# Patient Record
Sex: Male | Born: 1945 | Race: White | Hispanic: No | Marital: Married | Smoking: Former smoker
Health system: Southern US, Community
[De-identification: ages and names within clinical notes are randomized; demographics above are authoritative.]

## PROBLEM LIST (undated history)

## (undated) DIAGNOSIS — M1990 Unspecified osteoarthritis, unspecified site: Secondary | ICD-10-CM

## (undated) DIAGNOSIS — E785 Hyperlipidemia, unspecified: Secondary | ICD-10-CM

## (undated) DIAGNOSIS — I10 Essential (primary) hypertension: Secondary | ICD-10-CM

## (undated) HISTORY — PX: TOTAL HIP ARTHROPLASTY: SHX124

## (undated) HISTORY — DX: Hyperlipidemia, unspecified: E78.5

## (undated) HISTORY — DX: Essential (primary) hypertension: I10

---

## 2012-07-13 ENCOUNTER — Ambulatory Visit (INDEPENDENT_AMBULATORY_CARE_PROVIDER_SITE_OTHER): Payer: Self-pay | Admitting: Sports Medicine

## 2012-07-13 ENCOUNTER — Encounter: Payer: Self-pay | Admitting: Sports Medicine

## 2012-07-13 ENCOUNTER — Ambulatory Visit (INDEPENDENT_AMBULATORY_CARE_PROVIDER_SITE_OTHER): Payer: Self-pay

## 2012-07-13 VITALS — BP 147/75 | HR 61 | Temp 97.5°F | Wt 227.0 lb

## 2012-07-13 DIAGNOSIS — Z96649 Presence of unspecified artificial hip joint: Secondary | ICD-10-CM

## 2012-07-13 DIAGNOSIS — M25559 Pain in unspecified hip: Secondary | ICD-10-CM

## 2012-07-13 DIAGNOSIS — M545 Low back pain, unspecified: Secondary | ICD-10-CM

## 2012-07-13 DIAGNOSIS — Z299 Encounter for prophylactic measures, unspecified: Secondary | ICD-10-CM | POA: Insufficient documentation

## 2012-07-13 DIAGNOSIS — M79605 Pain in left leg: Secondary | ICD-10-CM | POA: Insufficient documentation

## 2012-07-13 DIAGNOSIS — I10 Essential (primary) hypertension: Secondary | ICD-10-CM

## 2012-07-13 DIAGNOSIS — E785 Hyperlipidemia, unspecified: Secondary | ICD-10-CM

## 2012-07-13 MED ORDER — GABAPENTIN 300 MG PO CAPS: ORAL_CAPSULE | ORAL | Status: DC

## 2012-07-13 MED ORDER — PREDNISONE 50 MG PO TABS: ORAL_TABLET | ORAL | Status: DC

## 2012-07-13 NOTE — Assessment & Plan Note (Signed)
Though unlikely, with pain in the thighs near the end of femoral component we do need to do x-rays to insure no loosening of the components.

## 2012-07-13 NOTE — Progress Notes (Addendum)
Subjective:    CC: Bilateral leg pain  HPI: Lipe is a very pleasant 67 year old male who comes in with a long history of pain that he localizes in the posterior aspect of both thighs radiating down all the way to his feet. He does have a history of lumbar degenerative disc disease and is status post an MRI and what sounds like a single interlaminar epidural steroid injection approximately 15 years ago. He does recall excellent response to this injection. Since then he has had bilateral hip replacements, but no other procedures. He notes he still has occasional back pain, and takes an occasional etodolac for this, his back pain is better with leaning forward while pushing a shopping cart for example. It does not wake him from sleep. He denies any constitutional symptoms. He denies any bowel or bladder dysfunction.  Past medical history, Surgical history, Family history not pertinant except as noted in chart, Social history, Allergies, and medications have been entered into the medical record, reviewed, and no changes needed.   Review of Systems: No headache, visual changes, nausea, vomiting, diarrhea, constipation, dizziness, abdominal pain, skin rash, fevers, chills, night sweats, swollen lymph nodes, weight loss, chest pain, body aches, joint swelling, muscle aches, shortness of breath, mood changes, visual or auditory hallucinations.  Objective:    General: Well Developed, well nourished, and in no acute distress.  Neuro: Alert and oriented x3, extra-ocular muscles intact.  HEENT: Normocephalic, atraumatic, pupils equal round reactive to light, neck supple, no masses, no lymphadenopathy, thyroid nonpalpable.  Skin: Warm and dry, no rashes noted.  Cardiac: Regular rate and rhythm, no murmurs rubs or gallops.  Respiratory: Clear to auscultation bilaterally. Not using accessory muscles, speaking in full sentences.  Abdominal: Soft, nontender, nondistended, positive bowel sounds, no masses, no  organomegaly.  Back Exam:  Inspection: Unremarkable  Motion: Flexion 45 deg, Extension 45 deg, Side Bending to 45 deg bilaterally,  Rotation to 45 deg bilaterally  SLR laying: Negative  XSLR laying: Negative  Palpable tenderness: None. FABER: negative. Sensory change: Gross sensation intact to all lumbar and sacral dermatomes.  Reflexes: 2+ at both patellar tendons, 2+ left Achilles, 1+ at right Achilles tendon, Babinski's downgoing.  Strength at foot  Plantar-flexion: 5/5 Dorsi-flexion: 5/5 Eversion: 5/5 Inversion: 5/5  Leg strength  Quad: 5/5 Hamstring: 5/5 Hip flexor: 5/5 Hip abductors: 5/5  Gait unremarkable.  Impression and Recommendations:    The patient was counselled, risk factors were discussed, anticipatory guidance given.

## 2012-07-13 NOTE — Assessment & Plan Note (Signed)
Slightly elevated but in pain. We can revisit this at a later date.

## 2012-07-13 NOTE — Assessment & Plan Note (Signed)
Symptoms are highly classic for lumbar spinal stenosis with improved pain on flexion. Pain is also radiating down the posterior aspect of both thighs. We will start conservatively with home rehabilitation, prednisone for 5 days, and the gabapentin up taper. We will also do x-rays of his lumbar spine, and I will see him back in 4-6 weeks. If no better, we can pursue MRI for interventional injection planning.

## 2012-07-13 NOTE — Assessment & Plan Note (Signed)
Up-to-date, colonoscopy in June of 2006. PSA was normal December 2012.  Tetanus shot May 2013.

## 2012-07-13 NOTE — Assessment & Plan Note (Signed)
Per patient well controlled, he generally gets this checked and medicines refilled at the [place].

## 2012-07-13 NOTE — Patient Instructions (Addendum)
Spinal Stenosis One cause of back pain is spinal stenosis. Stenosis means abnormal narrowing. The spinal canal contains and protects the spinal nerve roots. In spinal stenosis, the spinal canal narrows and pinches the spinal cord and nerves. This causes low back pain and pain in the legs. Stenosis may pinch the nerves that control muscles and sensation in the legs. This leads to pain and abnormal feelings in the leg muscles and areas supplied by those nerves. CAUSES  Spinal stenosis often happens to people as they get older and arthritic boney growths occur in their spinal canal. There is also a loss of the disk height between the bones of the back, which also adds to this problem. Sometimes the problem is present at birth. SYMPTOMS   Pain that is generally worse with activities, particularly standing and walking.  Numbness, tingling, hot or cold feelings, weakness, or a weariness in the legs.  Clumsiness, frequent falling, and a foot-slapping gait, which may come as a result of nerve pressure and muscle weakness. DIAGNOSIS   Your caregiver may suspect spinal stenosis if you have unusual leg symptoms, such as those previously mentioned.  Your orthopedic surgeon may request special imaging exams, such a computerized magnetic scan (MRI) or computerized X-ray scan (CT) to find out the cause of the problem. TREATMENT   Sometimes treatments such as postural changes or nonsteroidal anti-inflammatory drugs will relieve the pain.  Nonsteroidal anti-inflammatory medications may help relieve symptoms. These medicines do this by decreasing swelling and inflammation in the nerves.  When stenosis causes severe nerve root compression, conservative treatment may not be enough to maintain a normal lifestyle. Surgery may be recommended to relieve the pressure on affected nerves. In properly selected patients, the results are very good, and patients are able to continue a normal lifestyle. HOME CARE  INSTRUCTIONS   Flexing the spine by leaning forward while walking may relieve symptoms. Lying with the knees drawn up to the chest may offer some relief. These positions enlarge the space available to the nerves. They may make it easier for stenosis sufferers to walk longer distances.  Rest, followed by gradually resuming activity, also can help.  Aerobic activity, such as bicycling or swimming, is often recommended.  Losing weight can also relieve some of the load on the spine.  Application of warm or cold compresses to the area of pain can be helpful. SEEK MEDICAL CARE IF:   The periods of relief between episodes of pain become shorter and shorter.  You experience pain that radiates down your leg, even when you are not standing or walking. SEEK IMMEDIATE MEDICAL CARE IF:   You have a loss of bowel or bladder control.  You have a sudden loss of feeling in your legs.  You suddenly cannot move your legs. Document Released: 09/13/2003 Document Revised: 09/15/2011 Document Reviewed: 11/08/2009 [employer] Patient Information 2013 [employer].  

## 2012-07-14 ENCOUNTER — Telehealth: Payer: Self-pay | Admitting: *Deleted

## 2012-07-14 NOTE — Telephone Encounter (Signed)
Pt informed

## 2012-07-14 NOTE — Telephone Encounter (Signed)
Pt calling about x-ray results.  Please advise.

## 2012-07-14 NOTE — Telephone Encounter (Signed)
Hips look okay, back shows multilevel degenerative disc disease which we often see in spinal stenosis. This does not change the treatment plan.

## 2012-07-16 ENCOUNTER — Encounter: Payer: Self-pay | Admitting: Sports Medicine

## 2012-08-17 ENCOUNTER — Ambulatory Visit (INDEPENDENT_AMBULATORY_CARE_PROVIDER_SITE_OTHER): Payer: Self-pay | Admitting: Sports Medicine

## 2012-08-17 ENCOUNTER — Ambulatory Visit (INDEPENDENT_AMBULATORY_CARE_PROVIDER_SITE_OTHER): Payer: Self-pay

## 2012-08-17 VITALS — BP 151/75 | HR 58

## 2012-08-17 DIAGNOSIS — M79605 Pain in left leg: Secondary | ICD-10-CM

## 2012-08-17 DIAGNOSIS — M545 Low back pain, unspecified: Secondary | ICD-10-CM

## 2012-08-17 NOTE — Assessment & Plan Note (Signed)
Symptomatically lumbar spinal stenosis with right-sided lumbar radiculitis, radiating to the anterior aspect of the right thigh. He has failed conservative measures including prednisone, physical therapy, gabapentin, and is on was effective for short time. At this point I do think that we need to pursue MRI for interventional injection planning, this would likely be interlaminar epidural injection on the right side. I would like to see him back to go over results of the MRI and come up with the further plan

## 2012-08-17 NOTE — Progress Notes (Signed)
  Subjective:    CC: Followup  HPI: [name] returns for followup of low back pain. To recap, the pain is localized but does radiate down the right leg in an upper lumbar distribution. He's better with bending forward such as pushing a shopping cart and worse with standing upright. He did have an epidural injection approximately 15 years ago I provided him with good benefit. I briefly put in for formal physical therapy, steroids, and gabapentin, steroids helped significantly but the others were not effective. He did have multilevel degenerative disc disease as well as spondylosis noted on x-rays in the past.  Past medical history, Surgical history, Family history not pertinant except as noted below, Social history, Allergies, and medications have been entered into the medical record, reviewed, and no changes needed.   Review of Systems: No headache, visual changes, nausea, vomiting, diarrhea, constipation, dizziness, abdominal pain, skin rash, fevers, chills, night sweats, weight loss, swollen lymph nodes, body aches, joint swelling, muscle aches, chest pain, shortness of breath, mood changes, visual or auditory hallucinations.   Objective:   General: Well Developed, well nourished, and in no acute distress.  Neuro/Psych: Alert and oriented x3, extra-ocular muscles intact, able to move all 4 extremities, sensation grossly intact. Skin: Warm and dry, no rashes noted.  Respiratory: Not using accessory muscles, speaking in full sentences, trachea midline.  Cardiovascular: Pulses palpable, no extremity edema. Abdomen: Does not appear distended. Back Exam:  Inspection: Unremarkable  Motion: Flexion 45 deg, Extension 45 deg, Side Bending to 45 deg bilaterally,  Rotation to 45 deg bilaterally  SLR laying: Negative  XSLR laying: Negative  Palpable tenderness: None. FABER: negative. Sensory change: Gross sensation intact to all lumbar and sacral dermatomes.  Reflexes: 2+ at both patellar tendons, 2+  left Achilles, 1+ at right Achilles tendon, Babinski's downgoing.  Strength at foot  Plantar-flexion: 5/5 Dorsi-flexion: 5/5 Eversion: 5/5 Inversion: 5/5  Leg strength  Quad: 5/5 Hamstring: 5/5 Hip flexor: 5/5 Hip abductors: 5/5  Gait unremarkable. Impression and Recommendations:   This case required medical decision making of moderate complexity.

## 2012-08-20 ENCOUNTER — Ambulatory Visit: Payer: Self-pay | Admitting: Sports Medicine

## 2012-08-21 ENCOUNTER — Other Ambulatory Visit: Payer: Self-pay

## 2012-08-27 ENCOUNTER — Ambulatory Visit (INDEPENDENT_AMBULATORY_CARE_PROVIDER_SITE_OTHER): Payer: Self-pay | Admitting: Sports Medicine

## 2012-08-27 DIAGNOSIS — M545 Low back pain, unspecified: Secondary | ICD-10-CM

## 2012-08-27 DIAGNOSIS — M79605 Pain in left leg: Secondary | ICD-10-CM

## 2012-08-27 NOTE — Assessment & Plan Note (Signed)
[name] is multilevel bilateral foraminal stenosis with severe degenerative disc disease. Symptoms most likely represent an L4 radiculitis on the right side. Discussed with very bad at the L2-L3 as well as the L3-L4 level. At this point I think we should try a two-level injection. I think certainly we should hit and interlaminar right-sided L4-L5 injection to address the L4 nerve root right side. I think we should also get the L2-L3 level considering the degree of degeneration on MRI.

## 2012-08-27 NOTE — Progress Notes (Signed)
  Subjective:    CC: Followup MRI  HPI: [name] is a very pleasant 67 year old male with right-sided upper lumbar radiculitis comes in to followup results of his MRI. To recap, approximately 15 years ago he had an epidural steroid injection at an unknown level that provided a decade of benefit.  Since then he's had pain in his low back worse with flexion and Valsalva that radiates around the anterolateral aspect of his right thigh and down to the knee. Pain is moderate and stable.  Past medical history, Surgical history, Family history not pertinant except as noted below, Social history, Allergies, and medications have been entered into the medical record, reviewed, and no changes needed.   Review of Systems: No headache, visual changes, nausea, vomiting, diarrhea, constipation, dizziness, abdominal pain, skin rash, fevers, chills, night sweats, weight loss, swollen lymph nodes, body aches, joint swelling, muscle aches, chest pain, shortness of breath, mood changes, visual or auditory hallucinations.   Objective:   General: Well Developed, well nourished, and in no acute distress.  Neuro/Psych: Alert and oriented x3, extra-ocular muscles intact, able to move all 4 extremities, sensation grossly intact. Skin: Warm and dry, no rashes noted.  Respiratory: Not using accessory muscles, speaking in full sentences, trachea midline.  Cardiovascular: Pulses palpable, no extremity edema. Abdomen: Does not appear distended.  MRI was reviewed and shows multilevel degenerative disc disease worse at the L2-L3 level, but also present at the L3-L4, L4-L5, and L5-S1 levels. There is multilevel spinal stenosis.  Impression and Recommendations:   This case required medical decision making of moderate complexity.

## 2012-09-14 ENCOUNTER — Ambulatory Visit
Admission: RE | Admit: 2012-09-14 | Discharge: 2012-09-14 | Disposition: A | Discharge status: Home / Self Care | Payer: No Typology Code available for payment source | Source: Ambulatory Visit | Attending: Sports Medicine | Admitting: Sports Medicine

## 2012-09-14 MED ORDER — METHYLPREDNISOLONE ACETATE 40 MG/ML INJ SUSP (RADIOLOG
120.0000 mg | Freq: Once | INTRAMUSCULAR | Status: AC
  Administered 2012-09-14: 120 mg via EPIDURAL

## 2012-09-14 MED ORDER — IOHEXOL 180 MG/ML  SOLN
1.0000 mL | Freq: Once | INTRAMUSCULAR | Status: AC | PRN
  Administered 2012-09-14: 1 mL via EPIDURAL

## 2012-09-17 ENCOUNTER — Encounter: Payer: Self-pay | Admitting: Sports Medicine

## 2012-09-17 ENCOUNTER — Ambulatory Visit (INDEPENDENT_AMBULATORY_CARE_PROVIDER_SITE_OTHER): Payer: Self-pay | Admitting: Sports Medicine

## 2012-09-17 VITALS — BP 136/76 | HR 51 | Wt 232.0 lb

## 2012-09-17 DIAGNOSIS — M545 Low back pain, unspecified: Secondary | ICD-10-CM

## 2012-09-17 DIAGNOSIS — M79605 Pain in left leg: Secondary | ICD-10-CM

## 2012-09-17 NOTE — Progress Notes (Signed)
  Subjective:    CC: Followup injection  HPI: [name] is multilevel degenerative disc disease with multilevel spinal stenosis as well as foraminal stenosis. He recently underwent an L2-L3 interlaminar epidural steroid injection after failing conservative measures. He reports 100% resolution of pain and is very happy with the results.  Past medical history, Surgical history, Family history not pertinant except as noted below, Social history, Allergies, and medications have been entered into the medical record, reviewed, and no changes needed.   Review of Systems: No headache, visual changes, nausea, vomiting, diarrhea, constipation, dizziness, abdominal pain, skin rash, fevers, chills, night sweats, weight loss, swollen lymph nodes, body aches, joint swelling, muscle aches, chest pain, shortness of breath, mood changes, visual or auditory hallucinations.   Objective:   General: Well Developed, well nourished, and in no acute distress.  Neuro/Psych: Alert and oriented x3, extra-ocular muscles intact, able to move all 4 extremities, sensation grossly intact. Skin: Warm and dry, no rashes noted.  Respiratory: Not using accessory muscles, speaking in full sentences, trachea midline.  Cardiovascular: Pulses palpable, no extremity edema. Abdomen: Does not appear distended. Impression and Recommendations:   This case required medical decision making of moderate complexity.

## 2012-09-17 NOTE — Assessment & Plan Note (Signed)
Status post single interlaminar epidural steroid injection at the L2-L3 level, resolved all symptoms. I am releasing him, he can call us should his symptoms recur a concern over an additional L2-L3 interlaminar epidural steroid injection.

## 2012-10-25 ENCOUNTER — Ambulatory Visit (INDEPENDENT_AMBULATORY_CARE_PROVIDER_SITE_OTHER): Payer: Self-pay | Admitting: Sports Medicine

## 2012-10-25 ENCOUNTER — Encounter: Payer: Self-pay | Admitting: Sports Medicine

## 2012-10-25 VITALS — BP 161/77 | HR 52 | Wt 230.0 lb

## 2012-10-25 DIAGNOSIS — M545 Low back pain, unspecified: Secondary | ICD-10-CM

## 2012-10-25 DIAGNOSIS — M79605 Pain in left leg: Secondary | ICD-10-CM

## 2012-10-25 MED ORDER — TRAMADOL HCL 50 MG PO TABS: 50.0000 mg | ORAL_TABLET | Freq: Three times a day (TID) | ORAL | Status: DC | PRN

## 2012-10-25 NOTE — Progress Notes (Signed)
   Subjective:    CC: Followup  HPI: Lumbar degenerative disc disease: [name] came to see me sometime ago, he went through formal physical therapy, steroids, NSAIDs, muscle relaxers, eventually we got an MRI that showed multilevel degenerative disc disease at nearly every level. As he was having anterior thigh as well as back pain we sent him for an L2-L3 right-sided thalamic epidural steroid injection which provided him with complete relief of his anterior thigh symptoms. He returns today having pain predominately down the posterior aspect of both thighs right side worse than left, radiating down to the anterior aspect of his right knee causing numbness. In the past I had wanted to do a two-level injection, however he was only able to have the L2-L3 level. Pain is stable.  Past medical history, Surgical history, Family history not pertinant except as noted below, Social history, Allergies, and medications have been entered into the medical record, reviewed, and no changes needed.   Review of Systems: No headache, visual changes, nausea, vomiting, diarrhea, constipation, dizziness, abdominal pain, skin rash, fevers, chills, night sweats, weight loss, swollen lymph nodes, body aches, joint swelling, muscle aches, chest pain, shortness of breath, mood changes, visual or auditory hallucinations.   Objective:   General: Well Developed, well nourished, and in no acute distress.  Neuro/Psych: Alert and oriented x3, extra-ocular muscles intact, able to move all 4 extremities, sensation grossly intact. Skin: Warm and dry, no rashes noted.  Respiratory: Not using accessory muscles, speaking in full sentences, trachea midline.  Cardiovascular: Pulses palpable, no extremity edema. Abdomen: Does not appear distended.  I again reviewed the MRI, there is severe degenerative disc disease worse at the L2-L3 and L3-L4 levels, he does have bilateral foraminal stenosis, spinal stenosis as well with a broad-based  disc protrusion at the L2-L3, L3-L4, L4-L5, and L5-S1 levels.  Impression and Recommendations:   This case required medical decision making of moderate complexity.

## 2012-10-25 NOTE — Assessment & Plan Note (Addendum)
Excellent response to L2-L3 interlaminar epidural steroid injection that resolved all pain in the anterior aspect of his thighs. Now with pain he localizes down the posterior aspect of the buttock, thigh, to the anterior aspect of his right knee. This corresponds to the L4 nerve root, I am going to order a right-sided L4-L5 interlaminar epidural steroid injection.  Tramadol as needed until then.

## 2012-11-03 ENCOUNTER — Ambulatory Visit
Admission: RE | Admit: 2012-11-03 | Discharge: 2012-11-03 | Disposition: A | Discharge status: Home / Self Care | Payer: Self-pay | Source: Ambulatory Visit | Attending: Sports Medicine | Admitting: Sports Medicine

## 2012-11-03 ENCOUNTER — Telehealth: Payer: Self-pay | Admitting: *Deleted

## 2012-11-03 MED ORDER — IOHEXOL 180 MG/ML  SOLN
1.0000 mL | Freq: Once | INTRAMUSCULAR | Status: AC | PRN
  Administered 2012-11-03: 1 mL via EPIDURAL

## 2012-11-03 MED ORDER — ORPHENADRINE CITRATE ER 100 MG PO TB12: 100.0000 mg | ORAL_TABLET | Freq: Two times a day (BID) | ORAL | Status: DC

## 2012-11-03 MED ORDER — METHYLPREDNISOLONE ACETATE 40 MG/ML INJ SUSP (RADIOLOG
120.0000 mg | Freq: Once | INTRAMUSCULAR | Status: AC
  Administered 2012-11-03: 120 mg via EPIDURAL

## 2012-11-03 NOTE — Telephone Encounter (Signed)
Pt states he went this am to get his next injection this am he states he is having muscle spasm in his legs. He states they to,d him at GI to call and ask you for something for the spasms.

## 2012-11-03 NOTE — Telephone Encounter (Signed)
Pt informed

## 2012-11-03 NOTE — Telephone Encounter (Signed)
This can occasionally happen. I have added Norflex. If insufficient, I can add Flexeril.

## 2012-11-10 ENCOUNTER — Encounter: Payer: Self-pay | Admitting: Sports Medicine

## 2012-11-10 ENCOUNTER — Ambulatory Visit (INDEPENDENT_AMBULATORY_CARE_PROVIDER_SITE_OTHER): Payer: Self-pay | Admitting: Sports Medicine

## 2012-11-10 VITALS — BP 136/88 | HR 64 | Wt 219.0 lb

## 2012-11-10 DIAGNOSIS — M545 Low back pain, unspecified: Secondary | ICD-10-CM

## 2012-11-10 DIAGNOSIS — M79605 Pain in left leg: Secondary | ICD-10-CM

## 2012-11-10 NOTE — Assessment & Plan Note (Signed)
Resolution of anterior thigh pain with L2-L3 interlaminar epidural. Now with resolution of pain going down the back of the leg with L4-L5 interlaminar epidural. He is happy with the results and understands he can come see me whenever he wants should the pain recur.

## 2012-11-10 NOTE — Progress Notes (Signed)
  Subjective:    CC: Followup after injection  HPI: [name] is a very pleasant 67 year old male with fairly severe degenerative disc disease. He had an L2-L3 interlaminar epidural steroid injection that resolved all of his anterior thigh symptoms, most recently he had an interlaminar L4-L5 epidural injection, which resolved all pain running down his leg. Today he is very happy with the results, and has no complaints.  Past medical history, Surgical history, Family history not pertinant except as noted below, Social history, Allergies, and medications have been entered into the medical record, reviewed, and no changes needed.   Review of Systems: No fevers, chills, night sweats, weight loss, chest pain, or shortness of breath.   Objective:    General: Well Developed, well nourished, and in no acute distress.  Neuro: Alert and oriented x3, extra-ocular muscles intact, sensation grossly intact.  HEENT: Normocephalic, atraumatic, pupils equal round reactive to light, neck supple, no masses, no lymphadenopathy, thyroid nonpalpable.  Skin: Warm and dry, no rashes. Cardiac: Regular rate and rhythm, no murmurs rubs or gallops, no lower extremity edema.  Respiratory: Clear to auscultation bilaterally. Not using accessory muscles, speaking in full sentences. Impression and Recommendations:

## 2013-05-12 ENCOUNTER — Other Ambulatory Visit: Payer: Self-pay

## 2013-11-30 ENCOUNTER — Other Ambulatory Visit (HOSPITAL_COMMUNITY): Payer: Self-pay

## 2013-12-01 NOTE — Pre-Procedure Instructions (Addendum)
[name]  12/01/2013   Your procedure is scheduled on:  Wednesday, June 3rd  [facility] at 1130 AM.  Call this number if you have problems the morning of surgery: [phone]   Remember:   Do not eat food or drink liquids after midnight.   Take these medicines the morning of surgery with A SIP OF WATER: neurontin, ultram if needed   Do not wear jewelry, make-up or nail polish.  Do not wear lotions, powders, or perfumes. You may wear deodorant.  Do not shave 48 hours prior to surgery. Men may shave face and neck.  Do not bring valuables to the hospital.  [facility] is not responsible   for any belongings or valuables.               Contacts, dentures or bridgework may not be worn into surgery.  Leave suitcase in the car. After surgery it may be brought to your room.  For patients admitted to the hospital, discharge time is determined by your  treatment team.               Patients discharged the day of surgery will not be allowed to drive home.  Please read over the following fact sheets that you were given: Pain Booklet, Coughing and Deep Breathing, MRSA Information and Surgical Site Infection Prevention  [place] - Preparing for Surgery  Before surgery, you can play an important role.  Because skin is not sterile, your skin needs to be as free of germs as possible.  You can reduce the number of germs on you skin by washing with CHG (chlorahexidine gluconate) soap before surgery.  CHG is an antiseptic cleaner which kills germs and bonds with the skin to continue killing germs even after washing.  Please DO NOT use if you have an allergy to CHG or antibacterial soaps.  If your skin becomes reddened/irritated stop using the CHG and inform your nurse when you arrive at Short Stay.  Do not shave (including legs and underarms) for at least 48 hours prior to the first CHG shower.  You may shave your face.  Please follow these instructions  carefully:   1.  Shower with CHG Soap the night before surgery and the morning of Surgery.  2.  If you choose to wash your hair, wash your hair first as usual with your normal shampoo.  3.  After you shampoo, rinse your hair and body thoroughly to remove the shampoo.  4.  Use CHG as you would any other liquid soap.  You can apply CHG directly to the skin and wash gently with scrungie or a clean washcloth.  5.  Apply the CHG Soap to your body ONLY FROM THE NECK DOWN.  Do not use on open wounds or open sores.  Avoid contact with your eyes, ears, mouth and genitals (private parts).  Wash genitals (private parts) with your normal soap.  6.  Wash thoroughly, paying special attention to the area where your surgery will be performed.  7.  Thoroughly rinse your body with warm water from the neck down.  8.  DO NOT shower/wash with your normal soap after using and rinsing off the CHG Soap.  9.  Pat yourself dry with a clean towel.            10.  Wear clean pajamas.            11.  Place clean sheets on your bed  the night of your first shower and do not sleep with pets.  Day of Surgery  Do not apply any lotions/deoderants the morning of surgery.  Please wear clean clothes to the hospital/surgery center.

## 2013-12-02 ENCOUNTER — Encounter (HOSPITAL_COMMUNITY)
Admission: RE | Admit: 2013-12-02 | Discharge: 2013-12-02 | Disposition: A | Discharge status: Home / Self Care | Payer: Worker's Compensation | Source: Ambulatory Visit | Attending: Anesthesiology | Admitting: Anesthesiology

## 2013-12-02 ENCOUNTER — Ambulatory Visit (HOSPITAL_COMMUNITY): Admission: RE | Admit: 2013-12-02 | Payer: Worker's Compensation | Source: Ambulatory Visit

## 2013-12-02 ENCOUNTER — Encounter (HOSPITAL_COMMUNITY): Payer: Self-pay

## 2013-12-02 ENCOUNTER — Encounter (HOSPITAL_COMMUNITY)
Admission: RE | Admit: 2013-12-02 | Discharge: 2013-12-02 | Disposition: A | Discharge status: Home / Self Care | Payer: Worker's Compensation | Source: Ambulatory Visit | Attending: Orthopedic Surgery | Admitting: Orthopedic Surgery

## 2013-12-02 DIAGNOSIS — Z01812 Encounter for preprocedural laboratory examination: Secondary | ICD-10-CM | POA: Insufficient documentation

## 2013-12-02 DIAGNOSIS — Z01818 Encounter for other preprocedural examination: Secondary | ICD-10-CM | POA: Insufficient documentation

## 2013-12-02 HISTORY — DX: Unspecified osteoarthritis, unspecified site: M19.90

## 2013-12-02 LAB — SURGICAL PCR SCREEN
MRSA, PCR: NEGATIVE
Staphylococcus aureus: NEGATIVE

## 2013-12-02 LAB — CBC
HCT: 41.6 % (ref 39.0–52.0)
HEMOGLOBIN: 14.5 g/dL (ref 13.0–17.0)
MCH: 29.2 pg (ref 26.0–34.0)
MCHC: 34.9 g/dL (ref 30.0–36.0)
MCV: 83.7 fL (ref 78.0–100.0)
Platelets: 208 10*3/uL (ref 150–400)
RBC: 4.97 MIL/uL (ref 4.22–5.81)
RDW: 12.8 % (ref 11.5–15.5)
WBC: 5.2 10*3/uL (ref 4.0–10.5)

## 2013-12-02 LAB — COMPREHENSIVE METABOLIC PANEL
ALT: 18 U/L (ref 0–53)
AST: 25 U/L (ref 0–37)
Albumin: 3.6 g/dL (ref 3.5–5.2)
Alkaline Phosphatase: 66 U/L (ref 39–117)
BUN: 16 mg/dL (ref 6–23)
CALCIUM: 9.8 mg/dL (ref 8.4–10.5)
CO2: 27 mEq/L (ref 19–32)
CREATININE: 0.89 mg/dL (ref 0.50–1.35)
Chloride: 101 mEq/L (ref 96–112)
GFR calc Af Amer: >90 mL/min (ref >90)
GFR, EST NON AFRICAN AMERICAN: 88 mL/min — AB (ref >90)
GLUCOSE: 133 mg/dL — AB (ref 70–99)
Potassium: 3.9 mEq/L (ref 3.7–5.3)
SODIUM: 142 meq/L (ref 137–147)
TOTAL PROTEIN: 6.9 g/dL (ref 6.0–8.3)
Total Bilirubin: 0.6 mg/dL (ref 0.3–1.2)

## 2013-12-02 NOTE — Progress Notes (Addendum)
Have never seen a cardio and denies any heart problems. Requested LOV & ekg from [facility].   [phone]

## 2013-12-06 MED ORDER — ACETAMINOPHEN 10 MG/ML IV SOLN
1000.0000 mg | Freq: Four times a day (QID) | INTRAVENOUS | Status: DC
  Filled 2013-12-06: qty 100

## 2013-12-06 MED ORDER — CEFAZOLIN SODIUM-DEXTROSE 2-3 GM-% IV SOLR
2.0000 g | INTRAVENOUS | Status: AC
  Administered 2013-12-07: 2 g via INTRAVENOUS
  Filled 2013-12-06: qty 50

## 2013-12-06 MED ORDER — DEXAMETHASONE SODIUM PHOSPHATE 4 MG/ML IJ SOLN
4.0000 mg | Freq: Once | INTRAMUSCULAR | Status: AC
  Administered 2013-12-07: 10 mg via INTRAVENOUS
  Filled 2013-12-06: qty 1

## 2013-12-06 NOTE — Progress Notes (Signed)
Patient called with time change. Message left to arrive at 1100 am

## 2013-12-07 ENCOUNTER — Observation Stay (HOSPITAL_COMMUNITY): Payer: Worker's Compensation

## 2013-12-07 ENCOUNTER — Ambulatory Visit (HOSPITAL_COMMUNITY): Payer: Worker's Compensation

## 2013-12-07 ENCOUNTER — Encounter (HOSPITAL_COMMUNITY)
Admission: RE | Disposition: A | Discharge status: Home / Self Care | Payer: Self-pay | Source: Ambulatory Visit | Attending: Orthopedic Surgery

## 2013-12-07 ENCOUNTER — Observation Stay (HOSPITAL_COMMUNITY)
Admission: RE | Admit: 2013-12-07 | Discharge: 2013-12-08 | Disposition: A | Discharge status: Home / Self Care | Payer: Worker's Compensation | Source: Ambulatory Visit | Attending: Orthopedic Surgery | Admitting: Orthopedic Surgery

## 2013-12-07 ENCOUNTER — Encounter (HOSPITAL_COMMUNITY): Payer: Worker's Compensation | Admitting: Anesthesiology

## 2013-12-07 ENCOUNTER — Ambulatory Visit (HOSPITAL_COMMUNITY): Payer: Worker's Compensation | Admitting: Anesthesiology

## 2013-12-07 ENCOUNTER — Encounter (HOSPITAL_COMMUNITY): Payer: Self-pay | Admitting: *Deleted

## 2013-12-07 DIAGNOSIS — Z981 Arthrodesis status: Secondary | ICD-10-CM

## 2013-12-07 DIAGNOSIS — X500XXA Overexertion from strenuous movement or load, initial encounter: Secondary | ICD-10-CM | POA: Insufficient documentation

## 2013-12-07 DIAGNOSIS — M5020 Other cervical disc displacement, unspecified cervical region: Principal | ICD-10-CM | POA: Insufficient documentation

## 2013-12-07 DIAGNOSIS — M542 Cervicalgia: Secondary | ICD-10-CM | POA: Diagnosis present

## 2013-12-07 DIAGNOSIS — Z87891 Personal history of nicotine dependence: Secondary | ICD-10-CM | POA: Insufficient documentation

## 2013-12-07 HISTORY — PX: ANTERIOR CERVICAL DECOMP/DISCECTOMY FUSION: SHX1161

## 2013-12-07 SURGERY — ANTERIOR CERVICAL DECOMPRESSION/DISCECTOMY FUSION 2 LEVELS
Anesthesia: General | Site: Spine Cervical | Laterality: Right

## 2013-12-07 MED ORDER — GLYCOPYRROLATE 0.2 MG/ML IJ SOLN
INTRAMUSCULAR | Status: AC
  Filled 2013-12-07: qty 3

## 2013-12-07 MED ORDER — NEOSTIGMINE METHYLSULFATE 10 MG/10ML IV SOLN
INTRAVENOUS | Status: DC | PRN
  Administered 2013-12-07: 4 mg via INTRAVENOUS

## 2013-12-07 MED ORDER — ACETAMINOPHEN 10 MG/ML IV SOLN
1000.0000 mg | Freq: Four times a day (QID) | INTRAVENOUS | Status: DC
  Administered 2013-12-07 – 2013-12-08 (×3): 1000 mg via INTRAVENOUS
  Filled 2013-12-07 (×3): qty 100

## 2013-12-07 MED ORDER — OXYCODONE HCL 5 MG PO TABS
ORAL_TABLET | ORAL | Status: AC
Start: 2013-12-07 — End: 2013-12-08
  Filled 2013-12-07: qty 1

## 2013-12-07 MED ORDER — MIDAZOLAM HCL 5 MG/5ML IJ SOLN
INTRAMUSCULAR | Status: DC | PRN
  Administered 2013-12-07: 2 mg via INTRAVENOUS

## 2013-12-07 MED ORDER — PROPOFOL 10 MG/ML IV BOLUS
INTRAVENOUS | Status: AC
  Filled 2013-12-07: qty 20

## 2013-12-07 MED ORDER — SODIUM CHLORIDE 0.9 % IJ SOLN
INTRAMUSCULAR | Status: AC
  Filled 2013-12-07: qty 10

## 2013-12-07 MED ORDER — ACETAMINOPHEN 10 MG/ML IV SOLN
INTRAVENOUS | Status: AC
  Filled 2013-12-07: qty 100

## 2013-12-07 MED ORDER — MENTHOL 3 MG MT LOZG: 1.0000 | LOZENGE | OROMUCOSAL | Status: DC | PRN

## 2013-12-07 MED ORDER — FLEET ENEMA 7-19 GM/118ML RE ENEM: 1.0000 | ENEMA | Freq: Once | RECTAL | Status: AC | PRN

## 2013-12-07 MED ORDER — MIDAZOLAM HCL 2 MG/2ML IJ SOLN
INTRAMUSCULAR | Status: AC
  Filled 2013-12-07: qty 2

## 2013-12-07 MED ORDER — HEMOSTATIC AGENTS (NO CHARGE) OPTIME
TOPICAL | Status: DC | PRN
  Administered 2013-12-07: 1 via TOPICAL

## 2013-12-07 MED ORDER — PROMETHAZINE HCL 25 MG/ML IJ SOLN: 6.2500 mg | INTRAMUSCULAR | Status: DC | PRN

## 2013-12-07 MED ORDER — PHENOL 1.4 % MT LIQD
1.0000 | OROMUCOSAL | Status: DC | PRN
  Administered 2013-12-08: 1 via OROMUCOSAL
  Filled 2013-12-07: qty 177

## 2013-12-07 MED ORDER — GLYCOPYRROLATE 0.2 MG/ML IJ SOLN
INTRAMUSCULAR | Status: DC | PRN
  Administered 2013-12-07: 0.6 mg via INTRAVENOUS

## 2013-12-07 MED ORDER — FENTANYL CITRATE 0.05 MG/ML IJ SOLN
INTRAMUSCULAR | Status: AC
  Filled 2013-12-07: qty 5

## 2013-12-07 MED ORDER — LACTATED RINGERS IV SOLN
INTRAVENOUS | Status: DC
  Administered 2013-12-07 (×2): via INTRAVENOUS

## 2013-12-07 MED ORDER — ACETAMINOPHEN 10 MG/ML IV SOLN
INTRAVENOUS | Status: DC | PRN
  Administered 2013-12-07: 1000 mg via INTRAVENOUS

## 2013-12-07 MED ORDER — SODIUM CHLORIDE 0.9 % IJ SOLN: 3.0000 mL | INTRAMUSCULAR | Status: DC | PRN

## 2013-12-07 MED ORDER — SODIUM CHLORIDE 0.9 % IV SOLN: 250.0000 mL | INTRAVENOUS | Status: DC

## 2013-12-07 MED ORDER — BENAZEPRIL HCL 20 MG PO TABS
20.0000 mg | ORAL_TABLET | Freq: Every day | ORAL | Status: DC
  Administered 2013-12-07 – 2013-12-08 (×2): 20 mg via ORAL
  Filled 2013-12-07 (×2): qty 1

## 2013-12-07 MED ORDER — VECURONIUM BROMIDE 10 MG IV SOLR
INTRAVENOUS | Status: DC | PRN
  Administered 2013-12-07: 3 mg via INTRAVENOUS
  Administered 2013-12-07 (×3): 2 mg via INTRAVENOUS
  Administered 2013-12-07: 1 mg via INTRAVENOUS

## 2013-12-07 MED ORDER — FENTANYL CITRATE 0.05 MG/ML IJ SOLN
INTRAMUSCULAR | Status: DC | PRN
  Administered 2013-12-07 (×2): 50 ug via INTRAVENOUS
  Administered 2013-12-07: 100 ug via INTRAVENOUS
  Administered 2013-12-07 (×2): 50 ug via INTRAVENOUS

## 2013-12-07 MED ORDER — ONDANSETRON HCL 4 MG/2ML IJ SOLN
INTRAMUSCULAR | Status: AC
  Filled 2013-12-07: qty 2

## 2013-12-07 MED ORDER — METHOCARBAMOL 500 MG PO TABS
ORAL_TABLET | ORAL | Status: AC
Start: 2013-12-07 — End: 2013-12-08
  Filled 2013-12-07: qty 1

## 2013-12-07 MED ORDER — HYDROCHLOROTHIAZIDE 12.5 MG PO CAPS
12.5000 mg | ORAL_CAPSULE | Freq: Every day | ORAL | Status: DC
  Administered 2013-12-07 – 2013-12-08 (×2): 12.5 mg via ORAL
  Filled 2013-12-07 (×2): qty 1

## 2013-12-07 MED ORDER — CEFAZOLIN SODIUM 1-5 GM-% IV SOLN
1.0000 g | Freq: Three times a day (TID) | INTRAVENOUS | Status: AC
  Administered 2013-12-07 – 2013-12-08 (×2): 1 g via INTRAVENOUS
  Filled 2013-12-07 (×2): qty 50

## 2013-12-07 MED ORDER — METHOCARBAMOL 500 MG PO TABS
500.0000 mg | ORAL_TABLET | Freq: Four times a day (QID) | ORAL | Status: DC | PRN
  Administered 2013-12-07: 500 mg via ORAL

## 2013-12-07 MED ORDER — OXYCODONE HCL 5 MG/5ML PO SOLN: 5.0000 mg | Freq: Once | ORAL | Status: AC | PRN

## 2013-12-07 MED ORDER — DOCUSATE SODIUM 100 MG PO CAPS
100.0000 mg | ORAL_CAPSULE | Freq: Two times a day (BID) | ORAL | Status: DC
  Administered 2013-12-07 – 2013-12-08 (×2): 100 mg via ORAL
  Filled 2013-12-07 (×2): qty 1

## 2013-12-07 MED ORDER — MORPHINE SULFATE 2 MG/ML IJ SOLN: 1.0000 mg | INTRAMUSCULAR | Status: DC | PRN

## 2013-12-07 MED ORDER — DEXAMETHASONE 4 MG PO TABS
4.0000 mg | ORAL_TABLET | Freq: Four times a day (QID) | ORAL | Status: DC
  Administered 2013-12-08 (×3): 4 mg via ORAL
  Filled 2013-12-07 (×6): qty 1

## 2013-12-07 MED ORDER — OXYCODONE HCL 5 MG PO TABS
10.0000 mg | ORAL_TABLET | ORAL | Status: DC | PRN
  Administered 2013-12-07 – 2013-12-08 (×2): 10 mg via ORAL
  Filled 2013-12-07: qty 2

## 2013-12-07 MED ORDER — LIDOCAINE HCL (CARDIAC) 20 MG/ML IV SOLN
INTRAVENOUS | Status: AC
  Filled 2013-12-07: qty 5

## 2013-12-07 MED ORDER — BUPIVACAINE-EPINEPHRINE (PF) 0.25% -1:200000 IJ SOLN
INTRAMUSCULAR | Status: AC
  Filled 2013-12-07: qty 30

## 2013-12-07 MED ORDER — DEXTROSE 5 % IV SOLN
500.0000 mg | Freq: Four times a day (QID) | INTRAVENOUS | Status: DC | PRN
  Filled 2013-12-07: qty 5

## 2013-12-07 MED ORDER — ONDANSETRON HCL 4 MG/2ML IJ SOLN: 4.0000 mg | INTRAMUSCULAR | Status: DC | PRN

## 2013-12-07 MED ORDER — DEXAMETHASONE SODIUM PHOSPHATE 4 MG/ML IJ SOLN
4.0000 mg | Freq: Four times a day (QID) | INTRAMUSCULAR | Status: DC
  Administered 2013-12-07: 4 mg via INTRAVENOUS
  Filled 2013-12-07 (×6): qty 1

## 2013-12-07 MED ORDER — OXYCODONE HCL 5 MG PO TABS
ORAL_TABLET | ORAL | Status: AC
  Filled 2013-12-07: qty 2

## 2013-12-07 MED ORDER — EPHEDRINE SULFATE 50 MG/ML IJ SOLN
INTRAMUSCULAR | Status: DC | PRN
  Administered 2013-12-07 (×4): 10 mg via INTRAVENOUS

## 2013-12-07 MED ORDER — BENAZEPRIL-HYDROCHLOROTHIAZIDE 20-12.5 MG PO TABS: 1.0000 | ORAL_TABLET | Freq: Every day | ORAL | Status: DC

## 2013-12-07 MED ORDER — ONDANSETRON HCL 4 MG/2ML IJ SOLN
INTRAMUSCULAR | Status: DC | PRN
  Administered 2013-12-07: 4 mg via INTRAVENOUS

## 2013-12-07 MED ORDER — HYDROMORPHONE HCL PF 1 MG/ML IJ SOLN
0.2500 mg | INTRAMUSCULAR | Status: DC | PRN
  Administered 2013-12-07 (×4): 0.5 mg via INTRAVENOUS

## 2013-12-07 MED ORDER — LACTATED RINGERS IV SOLN
INTRAVENOUS | Status: DC
  Administered 2013-12-07: via INTRAVENOUS

## 2013-12-07 MED ORDER — OXYCODONE HCL 5 MG PO TABS
5.0000 mg | ORAL_TABLET | Freq: Once | ORAL | Status: AC | PRN
  Administered 2013-12-07: 5 mg via ORAL

## 2013-12-07 MED ORDER — ROCURONIUM BROMIDE 50 MG/5ML IV SOLN
INTRAVENOUS | Status: AC
  Filled 2013-12-07: qty 1

## 2013-12-07 MED ORDER — THROMBIN 20000 UNITS EX SOLR
CUTANEOUS | Status: AC
Start: 2013-12-07 — End: 2013-12-07
  Filled 2013-12-07: qty 20000

## 2013-12-07 MED ORDER — 0.9 % SODIUM CHLORIDE (POUR BTL) OPTIME
TOPICAL | Status: DC | PRN
  Administered 2013-12-07: 1000 mL

## 2013-12-07 MED ORDER — DEXAMETHASONE SODIUM PHOSPHATE 4 MG/ML IJ SOLN
INTRAMUSCULAR | Status: AC
  Filled 2013-12-07: qty 1

## 2013-12-07 MED ORDER — HYDROMORPHONE HCL PF 1 MG/ML IJ SOLN
INTRAMUSCULAR | Status: AC
Start: 2013-12-07 — End: 2013-12-08
  Filled 2013-12-07: qty 1

## 2013-12-07 MED ORDER — EPHEDRINE SULFATE 50 MG/ML IJ SOLN
INTRAMUSCULAR | Status: AC
  Filled 2013-12-07: qty 1

## 2013-12-07 MED ORDER — LIDOCAINE HCL (CARDIAC) 20 MG/ML IV SOLN
INTRAVENOUS | Status: DC | PRN
  Administered 2013-12-07: 100 mg via INTRAVENOUS

## 2013-12-07 MED ORDER — BUPIVACAINE-EPINEPHRINE 0.25% -1:200000 IJ SOLN
INTRAMUSCULAR | Status: DC | PRN
  Administered 2013-12-07: 4 mL

## 2013-12-07 MED ORDER — SIMVASTATIN 20 MG PO TABS
20.0000 mg | ORAL_TABLET | Freq: Every evening | ORAL | Status: DC
  Administered 2013-12-07: 20 mg via ORAL
  Filled 2013-12-07 (×2): qty 1

## 2013-12-07 MED ORDER — HYDROMORPHONE HCL PF 1 MG/ML IJ SOLN
INTRAMUSCULAR | Status: AC
  Filled 2013-12-07: qty 1

## 2013-12-07 MED ORDER — SODIUM CHLORIDE 0.9 % IJ SOLN: 3.0000 mL | Freq: Two times a day (BID) | INTRAMUSCULAR | Status: DC

## 2013-12-07 MED ORDER — PROPOFOL 10 MG/ML IV BOLUS
INTRAVENOUS | Status: DC | PRN
  Administered 2013-12-07: 150 mg via INTRAVENOUS

## 2013-12-07 MED ORDER — THROMBIN 20000 UNITS EX SOLR
CUTANEOUS | Status: DC | PRN
  Administered 2013-12-07: 14:00:00 via TOPICAL

## 2013-12-07 MED ORDER — NEOSTIGMINE METHYLSULFATE 10 MG/10ML IV SOLN
INTRAVENOUS | Status: AC
  Filled 2013-12-07: qty 1

## 2013-12-07 MED ORDER — ROCURONIUM BROMIDE 100 MG/10ML IV SOLN
INTRAVENOUS | Status: DC | PRN
  Administered 2013-12-07: 50 mg via INTRAVENOUS

## 2013-12-07 SURGICAL SUPPLY — 66 items
BIT DRILL SRG 14X2.2XFLT CHK (BIT) ×1 IMPLANT
BIT DRL SRG 14X2.2XFLT CHK (BIT) ×1
BLADE SURG ROTATE 9660 (MISCELLANEOUS) IMPLANT
BUR EGG ELITE 4.0 (BURR) IMPLANT
BUR EGG ELITE 4.0MM (BURR)
BUR MATCHSTICK NEURO 3.0 LAGG (BURR) IMPLANT
CANISTER SUCTION 2500CC (MISCELLANEOUS) ×3 IMPLANT
CLOSURE STERI-STRIP 1/2X4 (GAUZE/BANDAGES/DRESSINGS) ×1
CLSR STERI-STRIP ANTIMIC 1/2X4 (GAUZE/BANDAGES/DRESSINGS) ×2 IMPLANT
CORDS BIPOLAR (ELECTRODE) ×3 IMPLANT
COVER SURGICAL LIGHT HANDLE (MISCELLANEOUS) ×6 IMPLANT
CRADLE DONUT ADULT HEAD (MISCELLANEOUS) ×3 IMPLANT
DEVICE ENDSKLTN TC MED 8MM (Orthopedic Implant) ×2 IMPLANT
DRAPE C-ARM 42X72 X-RAY (DRAPES) ×3 IMPLANT
DRAPE POUCH INSTRU U-SHP 10X18 (DRAPES) ×3 IMPLANT
DRAPE SURG 17X23 STRL (DRAPES) ×3 IMPLANT
DRAPE U-SHAPE 47X51 STRL (DRAPES) ×3 IMPLANT
DRILL BIT SKYLINE 14MM (BIT) ×2
DRSG MEPILEX BORDER 4X4 (GAUZE/BANDAGES/DRESSINGS) ×3 IMPLANT
DURAPREP 26ML APPLICATOR (WOUND CARE) ×3 IMPLANT
ELECT COATED BLADE 2.86 ST (ELECTRODE) ×3 IMPLANT
ELECT PENCIL ROCKER SW 15FT (MISCELLANEOUS) ×3 IMPLANT
ELECT REM PT RETURN 9FT ADLT (ELECTROSURGICAL) ×3
ELECTRODE REM PT RTRN 9FT ADLT (ELECTROSURGICAL) ×1 IMPLANT
ENDOSKELTON TC IMPLANT 8MM MED (Orthopedic Implant) ×6 IMPLANT
GLOVE BIOGEL PI IND STRL 8 (GLOVE) ×1 IMPLANT
GLOVE BIOGEL PI IND STRL 8.5 (GLOVE) ×1 IMPLANT
GLOVE BIOGEL PI INDICATOR 8 (GLOVE) ×2
GLOVE BIOGEL PI INDICATOR 8.5 (GLOVE) ×2
GLOVE ECLIPSE 8.5 STRL (GLOVE) ×3 IMPLANT
GLOVE ORTHO TXT STRL SZ7.5 (GLOVE) ×3 IMPLANT
GOWN STRL REUS W/ TWL XL LVL3 (GOWN DISPOSABLE) ×2 IMPLANT
GOWN STRL REUS W/TWL 2XL LVL3 (GOWN DISPOSABLE) ×6 IMPLANT
GOWN STRL REUS W/TWL XL LVL3 (GOWN DISPOSABLE) ×4
KIT BASIN OR (CUSTOM PROCEDURE TRAY) ×3 IMPLANT
KIT ROOM TURNOVER OR (KITS) ×3 IMPLANT
NEEDLE SPNL 18GX3.5 QUINCKE PK (NEEDLE) ×3 IMPLANT
NS IRRIG 1000ML POUR BTL (IV SOLUTION) ×6 IMPLANT
PACK ORTHO CERVICAL (CUSTOM PROCEDURE TRAY) ×3 IMPLANT
PACK UNIVERSAL I (CUSTOM PROCEDURE TRAY) ×3 IMPLANT
PAD ARMBOARD 7.5X6 YLW CONV (MISCELLANEOUS) ×6 IMPLANT
PATTIES SURGICAL .25X.25 (GAUZE/BANDAGES/DRESSINGS) ×3 IMPLANT
PIN DISTRACTION 14 (PIN) ×3 IMPLANT
PIN RETAINER PRODISC 14 MM (PIN) ×3 IMPLANT
PLATE SKYLINE 2 LEVEL 34MM (Plate) ×3 IMPLANT
PUTTY BONE DBX 5CC MIX (Putty) ×3 IMPLANT
RESTRAINT LIMB HOLDER UNIV (RESTRAINTS) ×3 IMPLANT
SCREW SKYLINE 14MM SD-VA (Screw) ×12 IMPLANT
SCREW SKYLINE 16MM (Screw) ×6 IMPLANT
SPONGE INTESTINAL PEANUT (DISPOSABLE) ×3 IMPLANT
SPONGE LAP 4X18 X RAY DECT (DISPOSABLE) ×6 IMPLANT
SPONGE SURGIFOAM ABS GEL 100 (HEMOSTASIS) ×3 IMPLANT
SURGIFLO TRUKIT (HEMOSTASIS) ×3 IMPLANT
SUT MON AB 3-0 SH 27 (SUTURE) ×2
SUT MON AB 3-0 SH27 (SUTURE) ×1 IMPLANT
SUT SILK 2 0 (SUTURE)
SUT SILK 2-0 18XBRD TIE 12 (SUTURE) IMPLANT
SUT VIC AB 2-0 CT1 18 (SUTURE) ×3 IMPLANT
SYR BULB IRRIGATION 50ML (SYRINGE) ×3 IMPLANT
SYR CONTROL 10ML LL (SYRINGE) ×3 IMPLANT
TAPE CLOTH 4X10 WHT NS (GAUZE/BANDAGES/DRESSINGS) ×3 IMPLANT
TAPE UMBILICAL COTTON 1/8X30 (MISCELLANEOUS) ×3 IMPLANT
TOWEL OR 17X24 6PK STRL BLUE (TOWEL DISPOSABLE) ×3 IMPLANT
TOWEL OR 17X26 10 PK STRL BLUE (TOWEL DISPOSABLE) ×3 IMPLANT
WATER STERILE IRR 1000ML POUR (IV SOLUTION) IMPLANT
YANKAUER SUCT BULB TIP NO VENT (SUCTIONS) ×3 IMPLANT

## 2013-12-07 NOTE — Anesthesia Preprocedure Evaluation (Addendum)
Anesthesia Evaluation  Patient identified by MRN, date of birth, ID band Patient awake    Reviewed: Allergy & Precautions, H&P , NPO status , Patient's Chart, lab work & pertinent test results  History of Anesthesia Complications Negative for: history of anesthetic complications  Airway Mallampati: II TM Distance: >3 FB Neck ROM: Full    Dental  (+) Edentulous Upper, Edentulous Lower, Dental Advisory Given   Pulmonary former smoker,    Pulmonary exam normal       Cardiovascular hypertension, Pt. on medications     Neuro/Psych negative psych ROS   GI/Hepatic negative GI ROS, Neg liver ROS,   Endo/Other  negative endocrine ROS  Renal/GU negative Renal ROS     Musculoskeletal  (+) Arthritis -,   Abdominal   Peds  Hematology   Anesthesia Other Findings   Reproductive/Obstetrics                         Anesthesia Physical Anesthesia Plan  ASA: II  Anesthesia Plan: General   Post-op Pain Management:    Induction: Intravenous  Airway Management Planned: Oral ETT  Additional Equipment:   Intra-op Plan:   Post-operative Plan: Extubation in OR  Informed Consent: I have reviewed the patients History and Physical, chart, labs and discussed the procedure including the risks, benefits and alternatives for the proposed anesthesia with the patient or authorized representative who has indicated his/her understanding and acceptance.   Dental advisory given  Plan Discussed with: CRNA, Anesthesiologist and Surgeon  Anesthesia Plan Comments:        Anesthesia Quick Evaluation

## 2013-12-07 NOTE — H&P (Signed)
History of Present Illness The patient is a 68 year old male who comes in today for a preoperative History and Physical. The patient is scheduled for a ACDF C4-6 to be performed by Dr. Debria Garret D. Shon Baton, MD at [facility] on 12/07/2013 . Please see the hospital record for complete dictated history and physical.  Additional reason for visit:  Neck painis described as the following: The patient is seen today in referral from Dr [name]. The patient reports symptoms involving the right lateral neck ago. The symptoms began following a specific injury (DOI 09/03/13). The injury occurred while the patient was at work (lifting). Symptoms include neck pain and arm numbness (into the fingers). The pain radiates to the right upper arm.The patient describes their symptoms as severe.The patient does feel that the symptoms are unchanged. Symptoms are exacerbated by turning the head to the right, neck flexion and neck extension. Prior to being seen today the patient was previously evaluated in urgent care (and Dr [name]). Past treatment has included nonsteroidal anti-inflammatory drugs (Diclofenac) and opioid analgesics (and Prednisone). 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 Note for "Neck pain": The patient was placed on light duty with no lifting over 10lbs. and no use of right arm.    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 He states that symptoms are unchanged from previous visit. He is wanting to proceed with C4 to 6 ACDF as scheduled.     Allergies No Known Drug Allergies. 09/19/2013    Family History Heart Disease. Mother. Hypertension. Mother. First Degree Relatives Cancer. Father. Cerebrovascular Accident. Sister. Congestive Heart Failure. Maternal Grandmother, Paternal Grandfather, Paternal Grandmother.    Social History Not under pain contract No  history of drug/alcohol rehab Number of flights of stairs before winded. less than 1 Tobacco use. Former smoker. 09/19/2013: smoke(d) 1 pack(s) per day uses less than 1/2 can(s) smokeless per week Tobacco / smoke exposure. 09/19/2013: no Marital status. married Current drinker. 09/19/2013: Currently drinks beer only occasionally per week Children. 5 or more Current work status. working part time Living situation. live with spouse Exercise. Exercises weekly; does running / walking    Medication History Meloxicam (7.5MG  Tablet, 1 (one) Tablet Oral dailey with meals, Taken starting 09/19/2013) Active. (with food) Lisinopril ( Oral) Specific dose unknown - Active. Simvastatin (20MG  Tablet, Oral) Active.    Vitals 11/29/2013 10:31 AM Weight: 178 lb Height: 70 in Body Surface Area: 2 m Body Mass Index: 25.54 kg/m Temp.: 97.9 FPulse: 56 (Regular) BP: 137/75 (Sitting, Left Arm, Standard)     Objective Transcription  On exam, pleasant white male, A&O times 3 and in no acute distress. Gait is normal.  HEAD: Normocephalic, atraumatic. PERRLA, EOMI.  LUNGS: CTA bilaterally. No wheezing noted.  HEART: RRR, bradycardia. No murmurs noted.  ABDOMEN: Round, nondistended. NBS times 4. Soft and nontender.  SKIN: Warm and dry.  RESPIRATORY: No increase in respiratory effort. General: He is a pleasant gentleman who appears younger than his stated age. He is alert. He is oriented x3. No shortness of breath or chest pain. He has pain with cervical spine range of motion, positive Lhermitte sign with C6 radicular pain as well as C5 radicular pain; all on the right side. The left side, he has no shoulder, elbow, wrist pain with joint range of motion. He has 5/5 strength in the left upper extremity.    Musculoskeletal: Om the  right, he has got 4/5 deltoid, bicep, wrist extensor strength. He has numbness and dysesthesias in the C6 and C6 dermatomes  on the right side only. Triceps strength is 5/5. Grip strength is 5/5 on the right. Negative Hoffman's sign. Negative Babinski test. Negative inverted brachial radialis reflex. Normal gait pattern. Negative Hoffman's sign, negative Babinski test. Negative inverted brachial radialis reflex. Normal gait pattern. No history of incontinence of bowel or bladder.   His MRI as well as x-rays, were reviewed. His c-spine x-ray taken 09/19/2013 show some trace anterior traction spurs at the 4/5 and 5/6 level, loss of normal cervical lordosis, but no fracture, subluxation, or dislocation is noted. The MRI from 10/10/2013 demonstrates 2 level cervical disc pathology, C4/5, C5/6 with posterior disc herniation that extends behind the body of C5, from the 5/6 and 4/5 disc levels. There is marked foraminal compromise causing compression to the exiting C5 and C6 nerve roots. No cord signal change.   Assessment & Plan  At this point in time, the patient has C5 and C6 radiculopathy on the right side. He has an MRI which shows disc pathology and nerve compression at the C4/5 and C5/6 levels. At this point, it is clear that as a result of the work related injury, the patient developed a disc herniation and now has acute on chronic changes causing cervical radicular arm pain. Given the fact that there is severe nerve compression, I do not think injection therapy will be very beneficial to her. Given the fact that there are motor and sensory deficits, he is having progressive disability and pain, my recommendation is to move forward with a 2 level anterior cervical discectomy and fusion. We have reviewed the risks, which include infection, bleeding, nerve damage, death, stroke, paralysis, hardware failure, adjacent segment disease, need for posterior supplemental fusion, throat pain, swallowing difficulties, hoarseness in the voice. Both he and his wife have expressed an understanding of the procedure. All of their questions were  encouraged.

## 2013-12-07 NOTE — Brief Op Note (Signed)
12/07/2013  4:21 PM  PATIENT:  Elimelech Wierman  68 y.o. male  PRE-OPERATIVE DIAGNOSIS:  C4-6 HNP RIGHT SIDE  POST-OPERATIVE DIAGNOSIS:  C4-6 HNP RIGHT SIDE  PROCEDURE:  Procedure(s): ANTERIOR CERVICAL DECOMPRESSION/DISCECTOMY FUSION  C4-5, 5-6 (Right)  SURGEON:  Surgeon(s) and Role:    * Venita Lick, MD - Primary  PHYSICIAN ASSISTANT:   ASSISTANTS: Zonia Kief   ANESTHESIA:   general  EBL:  Total I/O In: 1000 [I.V.:1000] Out: 100 [Blood:100]  BLOOD ADMINISTERED:none  DRAINS: none   LOCAL MEDICATIONS USED:  MARCAINE     SPECIMEN:  No Specimen  DISPOSITION OF SPECIMEN:  N/A  COUNTS:  YES  TOURNIQUET:  * No tourniquets in log *  DICTATION: .Other Dictation: Dictation Number [id number]  PLAN OF CARE: Admit for overnight observation  PATIENT DISPOSITION:  PACU - hemodynamically stable.

## 2013-12-07 NOTE — Anesthesia Postprocedure Evaluation (Signed)
  Anesthesia Post-op Note  Patient: [name]  Procedure(s) Performed: Procedure(s): ANTERIOR CERVICAL DECOMPRESSION/DISCECTOMY FUSION  C4-5, 5-6 (Right)  Patient Location: PACU  Anesthesia Type:General  Level of Consciousness: awake, alert  and oriented  Airway and Oxygen Therapy: Patient Spontanous Breathing and Patient connected to nasal cannula oxygen  Post-op Pain: mild  Post-op Assessment: Post-op Vital signs reviewed, Patient's Cardiovascular Status Stable, Respiratory Function Stable, Patent Airway, No signs of Nausea or vomiting and Pain level controlled  Post-op Vital Signs: stable  Last Vitals:  Filed Vitals:   12/07/13 1824  BP: 143/68  Pulse: 75  Temp: 37 C  Resp: 16    Complications: No apparent anesthesia complications

## 2013-12-07 NOTE — Anesthesia Procedure Notes (Signed)
Procedure Name: Intubation Date/Time: 12/07/2013 1:01 PM Performed by: Sharlene Dory E Pre-anesthesia Checklist: Patient identified, Emergency Drugs available, Suction available, Patient being monitored and Timeout performed Patient Re-evaluated:Patient Re-evaluated prior to inductionOxygen Delivery Method: Circle system utilized Preoxygenation: Pre-oxygenation with 100% oxygen Intubation Type: IV induction Ventilation: Mask ventilation without difficulty and Oral airway inserted - appropriate to patient size Laryngoscope Size: Mac and 3 Grade View: Grade I Tube type: Oral Tube size: 7.5 mm Number of attempts: 1 Airway Equipment and Method: Stylet Placement Confirmation: ETT inserted through vocal cords under direct vision,  positive ETCO2 and breath sounds checked- equal and bilateral Secured at: 22 cm Tube secured with: Tape Dental Injury: Teeth and Oropharynx as per pre-operative assessment

## 2013-12-07 NOTE — Transfer of Care (Signed)
Immediate Anesthesia Transfer of Care Note  Patient: [name]  Procedure(s) Performed: Procedure(s): ANTERIOR CERVICAL DECOMPRESSION/DISCECTOMY FUSION  C4-5, 5-6 (Right)  Patient Location: PACU  Anesthesia Type:General  Level of Consciousness: awake and oriented  Airway & Oxygen Therapy: Patient Spontanous Breathing and Patient connected to nasal cannula oxygen  Post-op Assessment: Report given to PACU RN and Patient moving all extremities X 4  Post vital signs: Reviewed and stable  Complications: No apparent anesthesia complications

## 2013-12-08 MED ORDER — ONDANSETRON 4 MG PO TBDP: 4.0000 mg | ORAL_TABLET | Freq: Three times a day (TID) | ORAL | Status: AC | PRN

## 2013-12-08 MED ORDER — METHOCARBAMOL 500 MG PO TABS: 500.0000 mg | ORAL_TABLET | Freq: Four times a day (QID) | ORAL | Status: AC | PRN

## 2013-12-08 MED ORDER — OXYCODONE-ACETAMINOPHEN 10-325 MG PO TABS: 1.0000 | ORAL_TABLET | Freq: Four times a day (QID) | ORAL | Status: AC | PRN

## 2013-12-08 MED ORDER — DOCUSATE SODIUM 100 MG PO CAPS: 100.0000 mg | ORAL_CAPSULE | Freq: Two times a day (BID) | ORAL | Status: AC

## 2013-12-08 MED ORDER — POLYETHYLENE GLYCOL 3350 17 G PO PACK: 17.0000 g | PACK | Freq: Every day | ORAL | Status: AC

## 2013-12-08 NOTE — Care Management Note (Signed)
CARE MANAGEMENT NOTE 12/08/2013  Patient:  [facility]   Account Number:  [id number]  Date Initiated:  12/08/2013  Documentation initiated by:  Vance Peper  Subjective/Objective Assessment:   68 yr old male s/p C4-6 ACDF     Action/Plan:   No home health or DME needs identified.   Anticipated DC Date:  12/08/2013   Anticipated DC Plan:  HOME/SELF CARE      DC Planning Services  CM consult      Choice offered to / List presented to:          [facility] arranged  NA      Status of service:  Completed, signed off

## 2013-12-08 NOTE — Progress Notes (Signed)
    Subjective: Procedure(s) (LRB): ANTERIOR CERVICAL DECOMPRESSION/DISCECTOMY FUSION  C4-5, 5-6 (Right) 1 Day Post-Op  Patient reports pain as 2 on 0-10 scale.  Reports decreased arm pain reports incisional neck pain   Positive void Negative bowel movement Positive flatus Negative chest pain or shortness of breath  Objective: Vital signs in last 24 hours: Temp:  [97.4 F (36.3 C)-99.5 F (37.5 C)] 97.8 F (36.6 C) (06/04 0430) Pulse Rate:  [54-94] 72 (06/04 0430) Resp:  [11-21] 18 (06/04 0430) BP: (115-162)/(55-77) 115/55 mmHg (06/04 0430) SpO2:  [95 %-99 %] 95 % (06/04 0430) Weight:  [181 lb (82.101 kg)] 181 lb (82.101 kg) (06/03 1112)  Intake/Output from previous day: 06/03 0701 - 06/04 0700 In: 2420 [P.O.:340; I.V.:1980; IV Piggyback:100] Out: 100 [Blood:100]  Labs: No results found for this basename: WBC, RBC, HCT, PLT,  in the last 72 hours No results found for this basename: NA, K, CL, CO2, BUN, CREATININE, GLUCOSE, CALCIUM,  in the last 72 hours No results found for this basename: LABPT, INR,  in the last 72 hours  Physical Exam: Neurologically intact ABD soft Neurovascular intact Incision: dressing C/D/I Compartment soft no swelling/hematoma noted at incision site  Assessment/Plan: Patient stable  xrays satisfactory Mobilization with physical therapy Encourage incentive spirometry Continue care  Advance diet Up with therapy D/C this afternoon   Venita Lick, MD [facility] [phone]

## 2013-12-08 NOTE — Progress Notes (Signed)
Pt discharged to home. D/c instructions given, no questions verbalized. Vitals stable. 

## 2013-12-08 NOTE — Evaluation (Signed)
Occupational Therapy Evaluation Patient Details Name: [name] MRN: [id number] DOB: June 29, 1946 Today's Date: 12/08/2013    History of Present Illness s/p ACDF C4-5, C5-6   Clinical Impression    Prior to admission, pt was independent in all ADL and mobility.  Pt now performing at a modified independent level adhering to cervical precautions and use of Aspen collar as directed by MD.  All education completed with regard to implementing precautions during ADL and mobility.  Will have wife available to assist with IADL.  No further OT needs.  Follow Up Recommendations  No OT follow up    Equipment Recommendations  None recommended by OT    Recommendations for Other Services       Precautions / Restrictions Precautions Precautions: Cervical Required Braces or Orthoses: Cervical Brace Cervical Brace: Hard collar (off in bed, during shower and when eating per MD) Restrictions Weight Bearing Restrictions: No      Mobility Bed Mobility Overal bed mobility: Modified Independent (instructed and practiced log roll technique)                Transfers Overall transfer level: Modified independent Equipment used: None             General transfer comment: slow, but independent    Balance                                            ADL Overall ADL's : Modified independent                                       General ADL Comments: Instructed in cervical precautions related to ADL and reviewed handout. Pt instructed to avoid flexion, extension and rotation of neck when out of collar for sleeping, eating and showering.     Vision                     Perception     Praxis      Pertinent Vitals/Pain Sore throat, HA, informed RN     Hand Dominance Right   Extremity/Trunk Assessment Upper Extremity Assessment Upper Extremity Assessment: RUE deficits/detail RUE Deficits / Details: continues to have residual pain  and some numbness in fingers related to neck   Lower Extremity Assessment Lower Extremity Assessment: Overall WFL for tasks assessed   Cervical / Trunk Assessment Cervical / Trunk Assessment: Normal   Communication Communication Communication: No difficulties   Cognition Arousal/Alertness: Awake/alert Behavior During Therapy: WFL for tasks assessed/performed Overall Cognitive Status: Within Functional Limits for tasks assessed                     General Comments       Exercises       Shoulder Instructions      Home Living Family/patient expects to be discharged to:: Private residence Living Arrangements: Spouse/significant other Available Help at Discharge: Family;Available 24 hours/day (for 10 days) Type of Home: House             Bathroom Shower/Tub: Chief Strategy Officer: Standard     Home Equipment: None   Additional Comments: Pt received Aspen collar in MD office and was educated in use.      Prior Functioning/Environment Level of Independence: Independent  OT Diagnosis:     OT Problem List:     OT Treatment/Interventions:      OT Goals(Current goals can be found in the care plan section) Acute Rehab OT Goals Patient Stated Goal: pain free neck  OT Frequency:     Barriers to D/C:            Co-evaluation              End of Session Nurse Communication:  (pt with headache)  Activity Tolerance: Patient tolerated treatment well Patient left: in bed;with call bell/phone within reach   Time: [id number] OT Time Calculation (min): 26 min Charges:  OT General Charges $OT Visit: 1 Procedure OT Evaluation $Initial OT Evaluation Tier I: 1 Procedure OT Treatments $Self Care/Home Management : 8-22 mins G-Codes: OT G-codes **NOT FOR INPATIENT CLASS** Functional Assessment Tool Used: clinical judgement Functional Limitation: Self care Self Care Current Status (N6295(G8987): At least 1 percent but less than 20  percent impaired, limited or restricted Self Care Goal Status (M8413(G8988): At least 1 percent but less than 20 percent impaired, limited or restricted Self Care Discharge Status [id number](G8989): At least 1 percent but less than 20 percent impaired, limited or restricted  Dayton BailiffJulie Lynn Treylon Henard 12/08/2013, 11:22 AM [phone]

## 2013-12-08 NOTE — Progress Notes (Signed)
PT Cancellation Note  Patient Details Name: [name] MRN: [id number] DOB: 1946/05/01   Cancelled Treatment:    Reason Eval/Treat Not Completed: PT screened, no needs identified, will sign off;Other (comment) (Spoke with OT after OT eval. no needs identified)  Physical Therapy Note  Spoke with occupational therapy after initial evaluation. OT reports patient is functioning at a high level of independence and no physical therapy is indicated at this time. PT is signing-off. Please re-order if there is any significant change in patient status. Thank you for this referral.  Charlsie Merles, PT [phone]  Berton Mount 12/08/2013, 10:59 AM

## 2013-12-08 NOTE — Op Note (Signed)
[name], [name]          ACCOUNT NO.:  [id number]  MEDICAL RECORD NO.:  [id number]  LOCATION:  [id number]                        FACILITY:  [facility]  PHYSICIAN:  Alvy Beal, MD    DATE OF BIRTH:  1946-03-26  DATE OF PROCEDURE:  12/07/2013 DATE OF DISCHARGE:                              OPERATIVE REPORT   PREOPERATIVE DIAGNOSIS:  Cervical radiculopathy of C4-C5 and C5-C6 secondary to herniated disk.  POSTOPERATIVE DIAGNOSIS:  Cervical radiculopathy of C4-C5 and C5-C6 secondary to herniated disk.  OPERATIVE PROCEDURE:  Anterior cervical diskectomy and fusion of C4-C5 and C5-C6.  COMPLICATIONS:  None.  INSTRUMENTATION SYSTEM USED:  DePuy skyline anterior cervical plate affixed with 16 mm screws into C4, 14 mm screws into C5 and C6 with Titan titanium cages size 8 medium packed with DBX mix.  HISTORY:  This is a very pleasant 68 year old gentleman who is having progressing neck and radicular right arm pain.  Despite the attempts at conservative management, his quality of life continued to suffer and his pain continued to increase.  As a result, we elected to proceed with surgery.  All appropriate risks, benefits, and alternatives were discussed with the patient and consent was obtained.  OPERATIVE NOTE:  The patient was brought to the operating room, placed supine on the operating table.  After successful induction of general anesthesia and endotracheal intubation, TEDs and SCDs were applied.  He was turned supine on the surgical table.  The arms were tucked at the side and the anterior cervical spine was prepped and draped in a standard fashion.  Time-out was taken confirming the patient, procedure, and all other pertinent important data.  Once this was completed, I then used fluoro to identify the C5 vertebral body.  The proposed incision site was infiltrated with 0.25% Marcaine with epinephrine and transverse incision was made centered over the C5 vertebral body and  sharp dissection was carried out down to the deep fascia.  Deep fascia was sharply incised and I transected the platysma.  I then sharply dissected in the deep cervical and prevertebral fascia sweeping the omohyoid to the right and identifying and protecting the carotid sheath with a finger on the left side.  I then mobilized the esophagus and trachea until I was down to the anterior longitudinal ligament.  Retractors were placed, and I visualized the C4-C5 and C5-C6 disk space.  A needle was placed into the C4-C5 disk space and an x-ray was taken.  Once I confirmed I was at the appropriate level, bipolar electrocautery was used to mobilize the longus colli muscles from the midbody of the C4 to the midbody of the C6.  The Caspar self-retaining retractor blades were placed underneath the longus colli muscle.  The endotracheal cuff was deflated and the retractor was expanded to appropriate width and annulotomy at C5-C6 was performed with a 15 blade scalpel.  Combination of pituitary rongeurs, curettes, and Kerrison rongeurs was used to facilitate removing the disk material.  Distraction pins were then placed into the C5-C6 disk space and a gentle interlaminar spreader was used to obtain the distraction and then maintained with the distraction pins.  I then continued my dissection posteriorly.  On the right-hand side, there was  a fragment of disk material that I used my nerve hook to tease out.  This allowed me to continue develop the plane underneath the lamina.  I used a 1 mm Kerrison to resect the posterior longitudinal ligament.  I then made sure I had complete, I then removed the overhanging osteophyte from the posterior aspects of C5 and C6 vertebral body and then I went underneath the uncovertebral joint, completely removing this, so I could adequately decompress the foramen.  At this point, with the decompression complete, I rasped the endplates, so I had bleeding subchondral bone  measured and then placed the appropriate size implant.  Size 8 medium Titan titanium cage packed with DBX mix and malleted into the appropriate space and it was well fitting.  Distraction pins were then relocated into the C4 vertebral body and the C4-C5 disk space was expanded.  An annulotomy was performed and using the same technique that I used at C5-C6, I completed the diskectomy at C4-C5.  Again, a right-sided disk was removed.  It was not as large as that at C5-C6, but was easily removed.  I then was able to palpate underneath the vertebral bodies with my nerve hook.  I then developed a plane underneath the posterior longitudinal ligament and then used a 1 mm Kerrison to remove the posterior longitudinal ligament.  At this point in time, the endplates were rasped and I placed the same sized cage at this level.  Distraction pins were removed.  Bleeding was controlled with bone wax and I contoured anterior cervical plate, was affixed to using a bone and self-drilling screws into C4, C5, and C6.  All screws had excellent purchase.  Once they were flushed to the plate, I then used a torque mechanism to lock the screws according to [employer] standards.  I then removed all of the remaining retractors and checked to make sure the esophagus was not entrapped beneath the plate.  I irrigated the wound copiously with normal saline.  Once this was complete, I was able to return the trachea and esophagus to midline.  The platysma was reapproximated with 2-0 Vicryl interrupted sutures and then the skin was reapproximated with a 3-0 Monocryl.  Steri- Strips, dry dressing, and an Aspen collar were then applied and the patient was ultimately extubated and transferred to the PACU without incident.  At the end of the case, all needle and sponge counts were correct.  First assistant was Zonia KiefJames Owens, my PA, who was instrumental in assisting with retraction, suction, and wound  closure.     Alvy Bealahari D Ausha Sieh, MD     DDB/MEDQ  D:  12/07/2013  T:  12/08/2013  Job:  [id number]

## 2013-12-08 NOTE — Discharge Instructions (Signed)

## 2013-12-08 NOTE — Progress Notes (Signed)
Patient has no social work needs. Clinical Social Worker will sign off for now as social work intervention is no longer needed. Please consult [place] again if new need arises.    Sabino Niemann, MSW, [employer] [phone]

## 2013-12-09 ENCOUNTER — Encounter (HOSPITAL_COMMUNITY): Payer: Self-pay | Admitting: Orthopedic Surgery

## 2013-12-15 NOTE — Discharge Summary (Signed)
Patient ID: [name] MRN: [id number] DOB/AGE: 66/03/1946 66 y.o.  Admit date: 12/07/2013 Discharge date: 12/15/2013  Admission Diagnoses:  Active Problems:   Neck pain   Discharge Diagnoses:  Active Problems:   Neck pain  status post Procedure(s): ANTERIOR CERVICAL DECOMPRESSION/DISCECTOMY FUSION  C4-5, 5-6  Past Medical History  Diagnosis Date  . Hyperlipidemia   . Hypertension   . Arthritis     all of his fingers    Surgeries: Procedure(s): ANTERIOR CERVICAL DECOMPRESSION/DISCECTOMY FUSION  C4-5, 5-6 on 12/07/2013   Consultants:    Discharged Condition: Improved  Hospital Course: Jeremy NegusDominic Fernholz is an 68 y.o. male who was admitted 12/07/2013 for operative treatment of cervical ddd/stenosis  Patient failed conservative treatments (please see the history and physical for the specifics) and had severe unremitting pain that affects sleep, daily activities and work/hobbies. After pre-op clearance, the patient was taken to the operating room on 12/07/2013 and underwent  Procedure(s): ANTERIOR CERVICAL DECOMPRESSION/DISCECTOMY FUSION  C4-5, 5-6.    Patient was given perioperative antibiotics:  Anti-infectives   Start     Dose/Rate Route Frequency Ordered Stop   12/07/13 2100  ceFAZolin (ANCEF) IVPB 1 g/50 mL premix     1 g 100 mL/hr over 30 Minutes Intravenous Every 8 hours 12/07/13 1822 12/08/13 0533   12/06/13 1407  ceFAZolin (ANCEF) IVPB 2 g/50 mL premix     2 g 100 mL/hr over 30 Minutes Intravenous 30 min pre-op 12/06/13 1407 12/07/13 1305       Patient was given sequential compression devices and early ambulation to prevent DVT.   Patient benefited maximally from hospital stay and there were no complications. At the time of discharge, the patient was urinating/moving their bowels without difficulty, tolerating a regular diet, pain is controlled with oral pain medications and they have been cleared by PT/OT.   Recent vital signs: No data found.     Recent laboratory studies: No results found for this basename: WBC, HGB, HCT, PLT, NA, K, CL, CO2, BUN, CREATININE, GLUCOSE, PT, INR, CALCIUM, 2,  in the last 72 hours   Discharge Medications:     Medication List    STOP taking these medications       aspirin EC 81 MG tablet     etodolac 400 MG tablet  Commonly known as:  LODINE     meloxicam 7.5 MG tablet  Commonly known as:  MOBIC     traMADol 50 MG tablet  Commonly known as:  ULTRAM      TAKE these medications       benazepril-hydrochlorthiazide 20-12.5 MG per tablet  Commonly known as:  LOTENSIN HCT  Take 1 tablet by mouth daily.     docusate sodium 100 MG capsule  Commonly known as:  COLACE  Take 1 capsule (100 mg total) by mouth 2 (two) times daily.     methocarbamol 500 MG tablet  Commonly known as:  ROBAXIN  Take 1 tablet (500 mg total) by mouth every 6 (six) hours as needed for muscle spasms.     ondansetron 4 MG disintegrating tablet  Commonly known as:  ZOFRAN ODT  Take 1 tablet (4 mg total) by mouth every 8 (eight) hours as needed.     oxyCODONE-acetaminophen 10-325 MG per tablet  Commonly known as:  PERCOCET  Take 1 tablet by mouth every 6 (six) hours as needed for pain.     polyethylene glycol packet  Commonly known as:  MIRALAX / GLYCOLAX  Take 17  g by mouth daily.     simvastatin 20 MG tablet  Commonly known as:  ZOCOR  Take 20 mg by mouth every evening.        Diagnostic Studies: Dg Chest 2 View  12/02/2013   CLINICAL DATA:  Preop for anterior cervical decompression  EXAM: CHEST  2 VIEW  COMPARISON:  None.  FINDINGS: Cardiomediastinal silhouette is unremarkable. No acute infiltrate or pleural effusion. No pulmonary edema. Mild degenerative changes mid thoracic spine.  IMPRESSION: No active cardiopulmonary disease.   Electronically Signed   By: Natasha Mead M.D.   On: 12/02/2013 11:34   Dg Cervical Spine 2 Or 3 Views  12/07/2013   CLINICAL DATA:  Status post ACDF.  EXAM: CERVICAL SPINE - 2-3  VIEW  COMPARISON:  Intraoperative films from earlier the same day  FINDINGS: Patient is status post anterior cervical discectomyz at C4-5 and C5-6 with interbody fusion. Anterior plate extends from the C4 body down to the C6 level. Bony alignment is anatomic. No evidence for complicating features. No substantial prevertebral soft tissue swelling.  IMPRESSION: Status post ACDF with anterior plating from C4-C6. No evidence for immediate complications.   Electronically Signed   By: Kennith Center M.D.   On: 12/07/2013 18:04   Dg Cervical Spine 2-3 Views  12/07/2013   CLINICAL DATA:  C4-6 ACDF.  EXAM: DG C-ARM 61-120 MIN; CERVICAL SPINE - 2-3 VIEW  COMPARISON:  Plain films of cervical spine 12/07/2013.  FINDINGS: We are provided with 2 fluoroscopic spot views of the cervical spine. Images demonstrate anterior plate and screws and interbody spacers at C4-5 and C5-6. Hardware is intact. Vertebral body height and alignment are maintained.  IMPRESSION: C4-6 ACDF without complicating feature.   Electronically Signed   By: Drusilla Kanner M.D.   On: 12/07/2013 16:23   Dg Cervical Spine 2 Or 3 Views  12/07/2013   CLINICAL DATA:  Preoperative spinal fusion; arthropathy  EXAM: CERVICAL SPINE - 2-3 VIEW  COMPARISON:  None.  FINDINGS: Frontal and lateral views were obtained. There is no fracture or spondylolisthesis. Prevertebral soft tissues and predental space regions are normal. There is moderate disc space narrowing at C5-6. There is slight disk space narrowing at C7. There is calcification in the anterior ligament at multiple levels. There is nuchal ligament calcification posteriorly at the C5-6 level. No erosive change. There are foci of carotid artery calcification bilaterally.  IMPRESSION: Osteoarthritic change, most notably at C5-6. No fracture or spondylolisthesis.   Electronically Signed   By: Bretta Bang M.D.   On: 12/07/2013 11:10   Dg C-arm 61-120 Min  12/07/2013   CLINICAL DATA:  C4-6 ACDF.  EXAM: DG  C-ARM 61-120 MIN; CERVICAL SPINE - 2-3 VIEW  COMPARISON:  Plain films of cervical spine 12/07/2013.  FINDINGS: We are provided with 2 fluoroscopic spot views of the cervical spine. Images demonstrate anterior plate and screws and interbody spacers at C4-5 and C5-6. Hardware is intact. Vertebral body height and alignment are maintained.  IMPRESSION: C4-6 ACDF without complicating feature.   Electronically Signed   By: Drusilla Kanner M.D.   On: 12/07/2013 16:23        Discharge Instructions   Call MD / Call 911    Complete by:  As directed   If you experience chest pain or shortness of breath, CALL 911 and be transported to the hospital emergency room.  If you develope a fever above 101 F, pus (white drainage) or increased drainage or redness at the  wound, or calf pain, call your surgeon's office.     Constipation Prevention    Complete by:  As directed   Drink plenty of fluids.  Prune juice may be helpful.  You may use a stool softener, such as Colace (over the counter) 100 mg twice a day.  Use MiraLax (over the counter) for constipation as needed.     Diet - low sodium heart healthy    Complete by:  As directed      Driving restrictions    Complete by:  As directed   No driving until further notice.     Increase activity slowly as tolerated    Complete by:  As directed      Lifting restrictions    Complete by:  As directed   No lifting until further notice.           Follow-up Information   Schedule an appointment as soon as possible for a visit with Alvy Beal, MD. (need return office visit 2 weeks postop)    Specialty:  Orthopedic Surgery   Contact information:   [address] [place] [address] [phone]       Discharge Plan:  discharge to home  Disposition:     Signed: Naida Sleight for Dr. Venita Lick [facility] [phone] 12/15/2013, 8:59 AM

## 2013-12-19 NOTE — Discharge Summary (Signed)
Agree with above Plan as discussed 

## 2014-01-17 ENCOUNTER — Ambulatory Visit: Payer: Self-pay | Admitting: Physical Therapy

## 2014-10-14 IMAGING — CR DG LUMBAR SPINE COMPLETE 4+V
5 series · 5 of 5 positions shown · non-contrast
Comparison: None.

CLINICAL DATA: Hip and back pain

LUMBAR SPINE - COMPLETE 4+ VIEW

[view not recorded (1 of 5)]
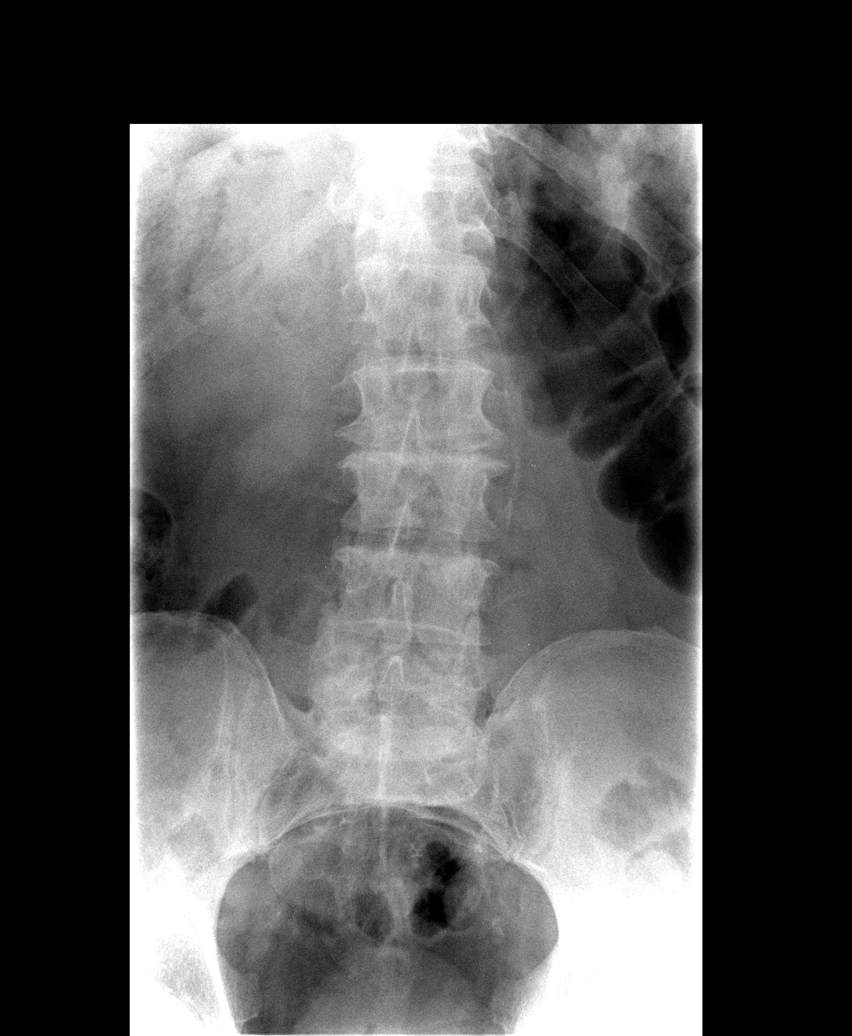

[view not recorded (2 of 5)]
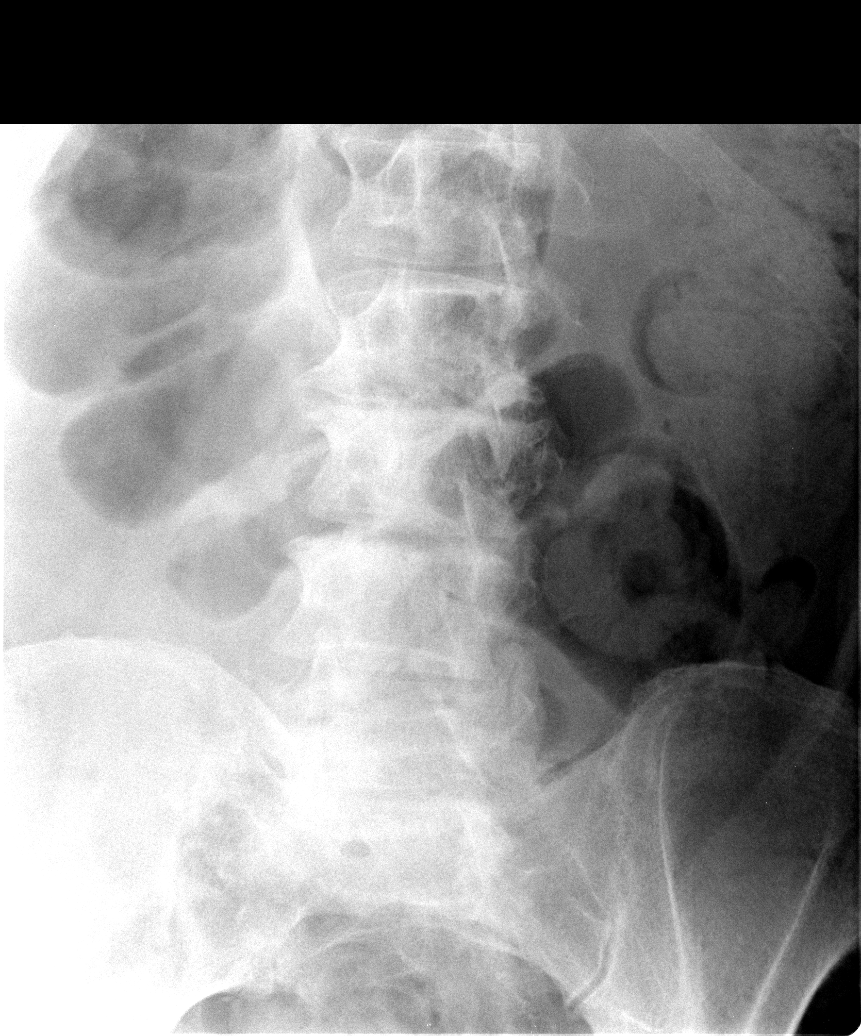

[view not recorded (3 of 5)]
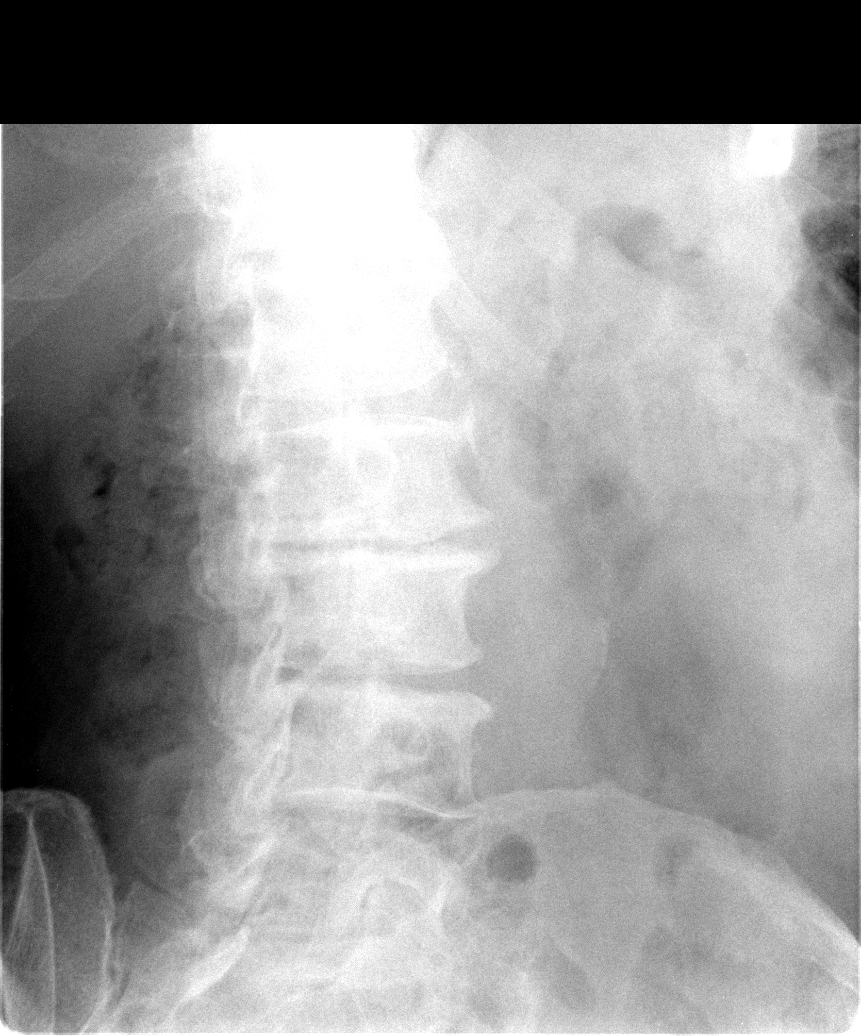

[view not recorded (4 of 5)]
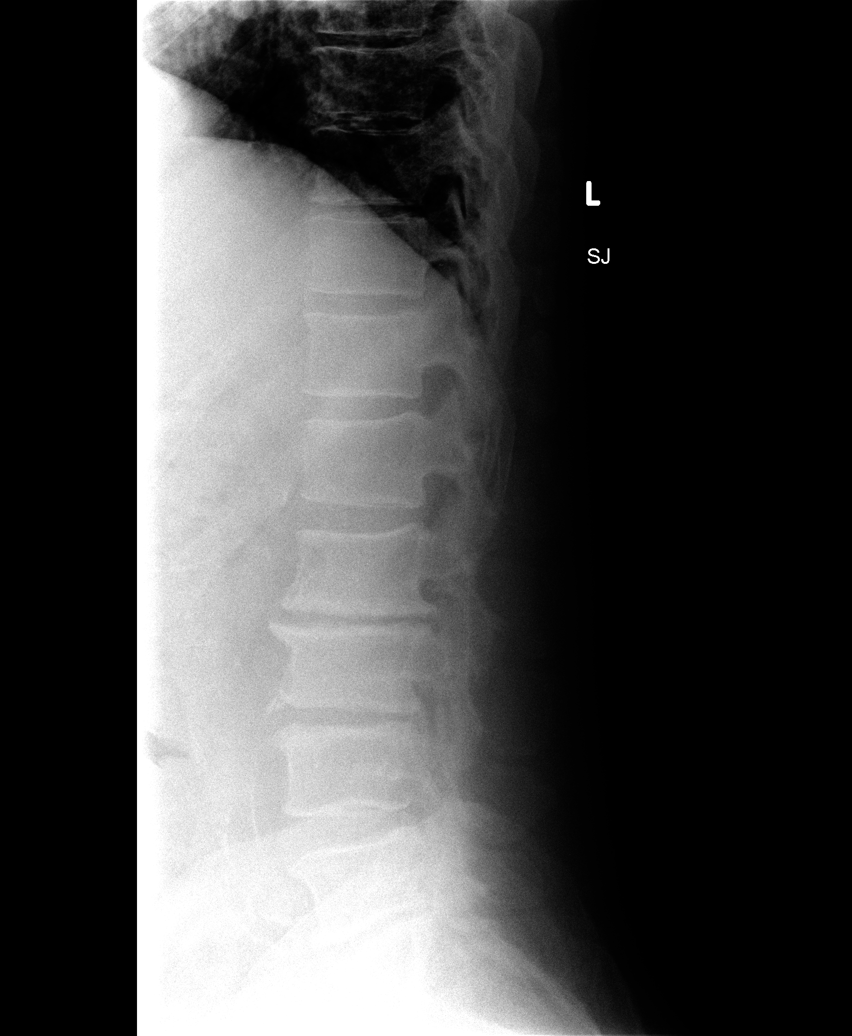

[view not recorded (5 of 5)]
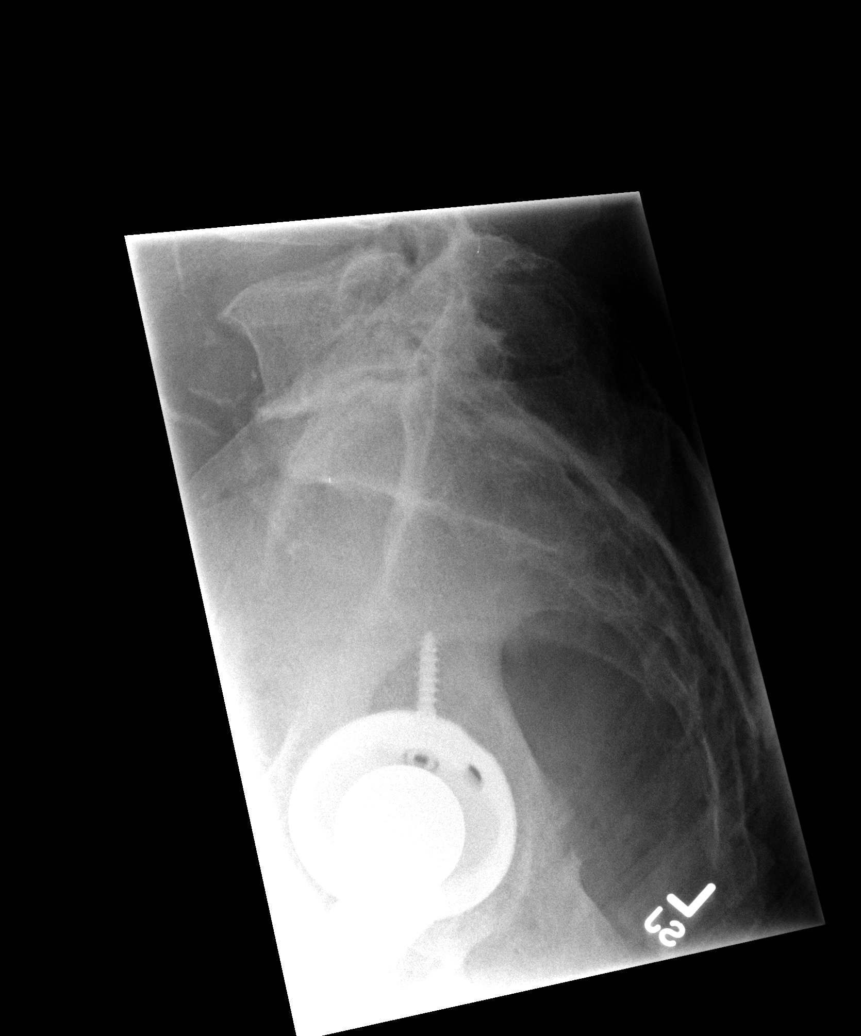

[5 of 5 positions shown; findings below may reference images not displayed]

FINDINGS: The lumbar vertebrae are straightened in alignment.
There is degenerative disc disease most marked at L2-3, L3-4, and
L5 S1 levels where there is loss of disc space, sclerosis, and
spurring.  No compression deformity is seen.  There is diffuse
degenerative disc disease particularly in the lower lumbar spine.
The SI joints appear corticated.  There is curvilinear
calcification consistent with the calcification of the abdominal
aorta.  No definite aneurysm is seen.  Bilateral hip replacements
are noted.
IMPRESSION: Straightened alignment with degenerative disc disease as described
above.  No acute abnormality.

## 2014-10-14 IMAGING — CR DG HIP COMPLETE 2+V*R*
3 series · 3 of 3 positions shown · non-contrast
Comparison: None.

CLINICAL DATA: Hip and back pain

RIGHT HIP - COMPLETE 2+ VIEW

[view not recorded (1 of 3)]
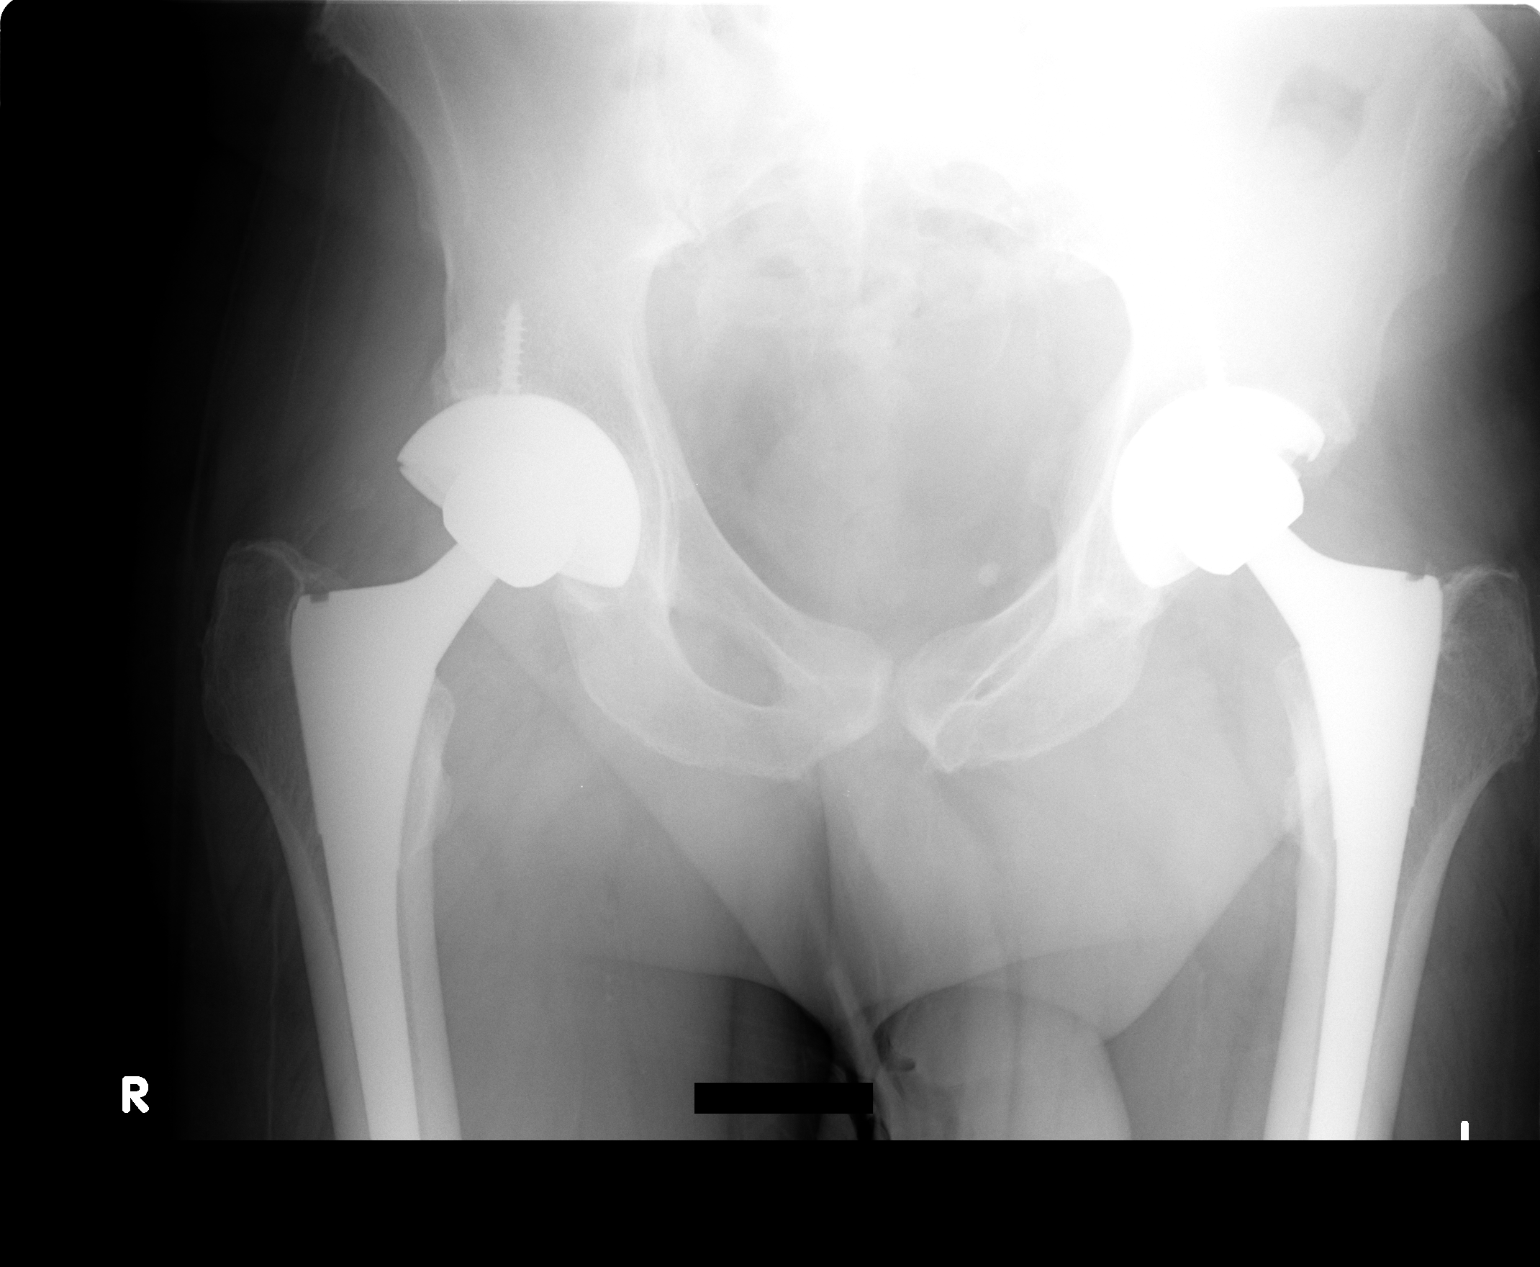

[view not recorded (2 of 3)]
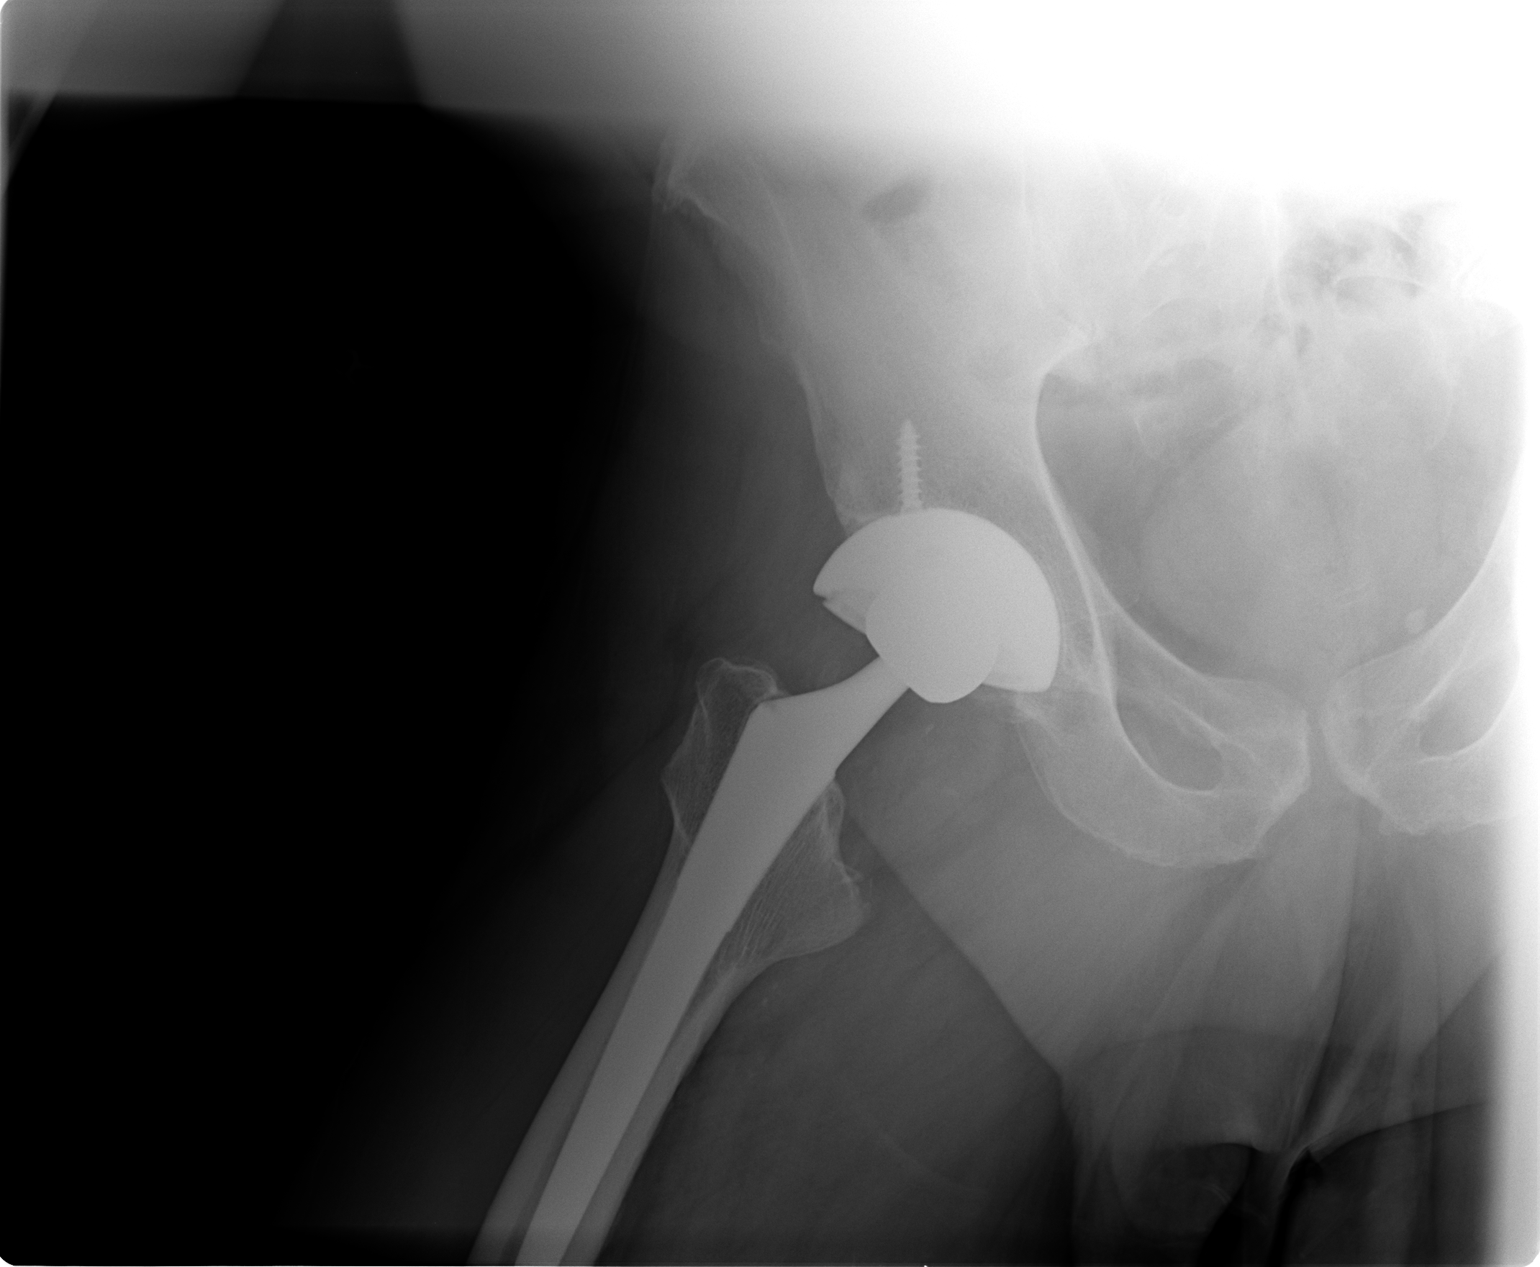

[view not recorded (3 of 3)]
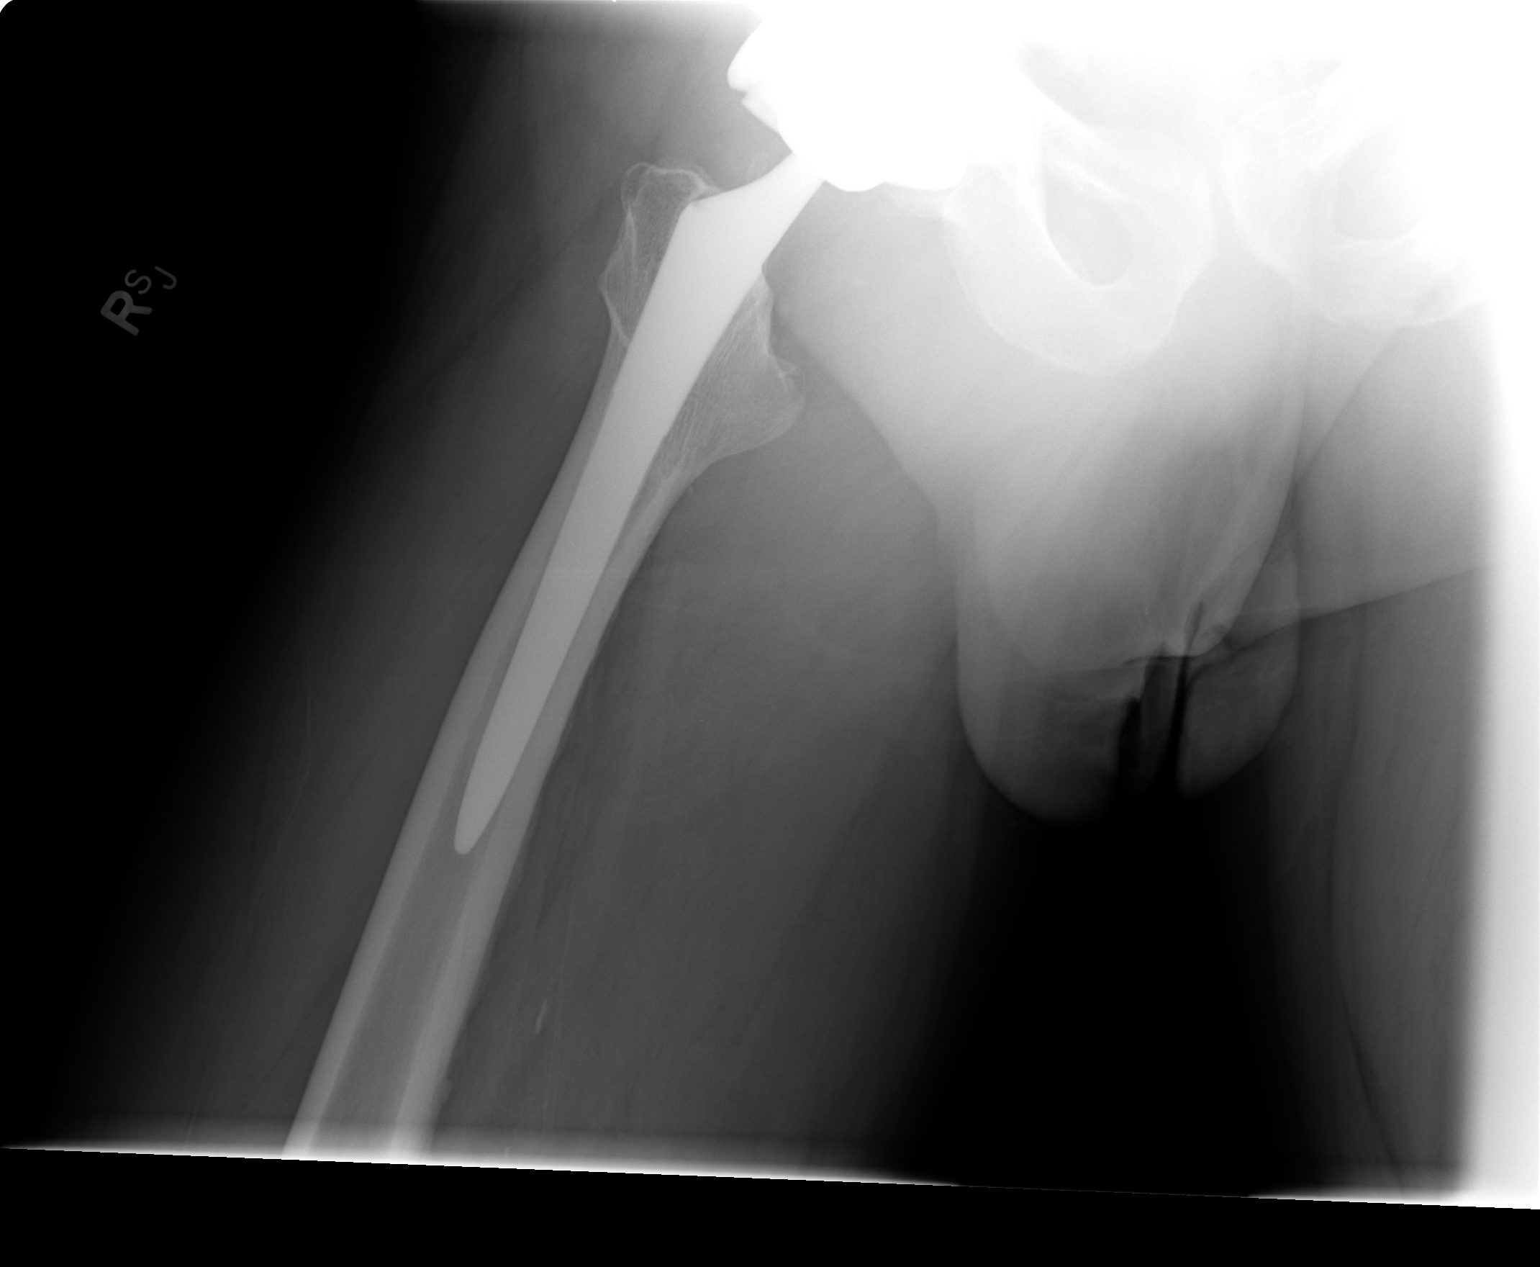

[3 of 3 positions shown; findings below may reference images not displayed]

FINDINGS: Bilateral total hip replacements are present and in good
position on the images obtained.  No complications are seen.  The
pelvic rami are intact.
IMPRESSION: Bilateral total hip replacements.  No abnormality.
